# Patient Record
Sex: Male | Born: 1950
Health system: Southern US, Community
[De-identification: ages and names within clinical notes are randomized; demographics above are authoritative.]

## PROBLEM LIST (undated history)

## (undated) DIAGNOSIS — Z87442 Personal history of urinary calculi: Secondary | ICD-10-CM

## (undated) DIAGNOSIS — C61 Malignant neoplasm of prostate: Secondary | ICD-10-CM

## (undated) DIAGNOSIS — K219 Gastro-esophageal reflux disease without esophagitis: Secondary | ICD-10-CM

## (undated) DIAGNOSIS — E785 Hyperlipidemia, unspecified: Secondary | ICD-10-CM

## (undated) DIAGNOSIS — I10 Essential (primary) hypertension: Secondary | ICD-10-CM

## (undated) HISTORY — PX: EXTRACORPOREAL SHOCK WAVE LITHOTRIPSY: SHX1557

## (undated) HISTORY — PX: PILONIDAL CYST EXCISION: SHX744

## (undated) HISTORY — PX: PTOSIS REPAIR: SHX6568

## (undated) HISTORY — DX: Malignant neoplasm of prostate: C61

## (undated) HISTORY — PX: HERNIA REPAIR: SHX51

## (undated) HISTORY — PX: INGUINAL HERNIA REPAIR: SUR1180

## (undated) HISTORY — PX: KIDNEY STONE SURGERY: SHX686

---
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
## 1999-12-23 ENCOUNTER — Encounter: Payer: Self-pay | Admitting: Internal Medicine

## 1999-12-23 ENCOUNTER — Ambulatory Visit (HOSPITAL_COMMUNITY): Admission: RE | Admit: 1999-12-23 | Discharge: 1999-12-23 | Payer: Self-pay | Admitting: Internal Medicine

## 2001-07-01 ENCOUNTER — Emergency Department (HOSPITAL_COMMUNITY): Admission: EM | Admit: 2001-07-01 | Discharge: 2001-07-02 | Payer: Self-pay | Admitting: Emergency Medicine

## 2001-07-02 ENCOUNTER — Ambulatory Visit (HOSPITAL_COMMUNITY): Admission: AD | Admit: 2001-07-02 | Discharge: 2001-07-02 | Payer: Self-pay | Admitting: Urology

## 2001-07-02 ENCOUNTER — Encounter: Payer: Self-pay | Admitting: Emergency Medicine

## 2001-07-05 ENCOUNTER — Ambulatory Visit (HOSPITAL_BASED_OUTPATIENT_CLINIC_OR_DEPARTMENT_OTHER): Admission: RE | Admit: 2001-07-05 | Discharge: 2001-07-05 | Payer: Self-pay | Admitting: Urology

## 2001-07-05 ENCOUNTER — Encounter: Payer: Self-pay | Admitting: Urology

## 2004-10-08 ENCOUNTER — Ambulatory Visit (HOSPITAL_COMMUNITY): Admission: RE | Admit: 2004-10-08 | Discharge: 2004-10-08 | Payer: Self-pay | Admitting: Gastroenterology

## 2006-06-02 DIAGNOSIS — Z87442 Personal history of urinary calculi: Secondary | ICD-10-CM

## 2006-06-02 HISTORY — DX: Personal history of urinary calculi: Z87.442

## 2009-10-16 ENCOUNTER — Ambulatory Visit (HOSPITAL_COMMUNITY): Admission: RE | Admit: 2009-10-16 | Discharge: 2009-10-16 | Payer: Self-pay | Admitting: Neurological Surgery

## 2010-10-18 NOTE — Op Note (Signed)
[facility]  Patient:    Dillon Arias, Dillon Arias Visit Number: [id number] MRN: [id number]          Service Type: DSU Location: DAY Attending Physician:  Ellwood Handler Dictated by:   Verl Dicker, M.D. Proc. Date: 07/02/01 Admit Date:  07/02/2001 Discharge Date: 07/02/2001   CC:         Pearla Dubonnet, M.D.   Operative Report  DOB:  1950/07/02.  UROLOGIST:  Verl Dicker, M.D.  PREOPERATIVE DIAGNOSIS:  An 11-mm left ureteral calculus, 5-mm left renal calculus.  POSTOPERATIVE DIAGNOSIS:  An 11-mm left ureteral calculus, 5-mm left renal calculus.  PROCEDURE:  Cystoscopy, retrogrades, ureteroscopy, and Double J stent placement.  ANESTHESIA:  General.  DRAINS:  26 cm 6-French left Double J stent.  DESCRIPTION OF PROCEDURE:  The patient was prepped and draped in the dorsal lithotomy position after institution of adequate level of general anesthesia. A well-lubricated 17-French panendoscope was gently inserted at the urethral meatus; normal urethra and sphincter, nonobstructive prostate. Careful inspection of the bladder with the 30 and 70 degree lenses showed clear efflux at the right orifice, minimal efflux at the left orifice but no evidence of intravesical pathology. A 21-French panendoscope was then gently inserted. Right retrograde showed normal course and caliber of the ureter. Pelvis and calices with prompt drainage of three to five minutes. Left retrograde showed slight narrowing in the distal ureter but no obvious filling defect. The patient had an 11 mm filling defect in the left mid ureter and a 5 mm filling defect in the left renal pelvis. With injection of contrast at the time of the retrogrades, stone appeared to move several centimeters proximal within the ureter. Decision was made to pass a guidewire alongside the stone which coiled nicely in the upper pole calices. The short 6-French ureteroscope was  then used alongside the guidewire. As irrigant was run with the ureteroscope, the stone continued its proximal migration to a spot beyond reach of the short 6-French scope. The 6-French scope was then withdrawn and replaced with the long 6-French scope which was inserted alongside the guidewire, but again, with irrigant, the stone migrated back into the renal pelvis beyond the reach of the ureteroscope. The ureteroscope was the withdrawn. A 6-French 26 cm Double J stent was then passed over the patients indwelling guidewire with excellent pigtail formation on guidewire removal. The bladder was drained. The cystoscope was removed. The patient was given a B&O suppository and 30 mg of Toradol and was returned to recovery in satisfactory condition. Dictated by:   Verl Dicker, M.D. Attending Physician:  Ellwood Handler DD:  07/02/01 TD:  07/04/01 Job: [id number]

## 2011-04-21 ENCOUNTER — Other Ambulatory Visit: Payer: Self-pay | Admitting: Internal Medicine

## 2011-04-21 DIAGNOSIS — R1013 Epigastric pain: Secondary | ICD-10-CM

## 2011-04-28 ENCOUNTER — Ambulatory Visit
Admission: RE | Admit: 2011-04-28 | Discharge: 2011-04-28 | Disposition: A | Discharge status: Home / Self Care | Payer: 59 | Source: Ambulatory Visit | Attending: Internal Medicine | Admitting: Internal Medicine

## 2011-04-28 DIAGNOSIS — R1013 Epigastric pain: Secondary | ICD-10-CM

## 2013-08-10 ENCOUNTER — Emergency Department
Admission: EM | Admit: 2013-08-10 | Discharge: 2013-08-10 | Disposition: A | Discharge status: Home / Self Care | Payer: 59 | Source: Home / Self Care | Attending: Family Medicine | Admitting: Family Medicine

## 2013-08-10 ENCOUNTER — Encounter: Payer: Self-pay | Admitting: Emergency Medicine

## 2013-08-10 DIAGNOSIS — R1031 Right lower quadrant pain: Secondary | ICD-10-CM

## 2013-08-10 HISTORY — DX: Gastro-esophageal reflux disease without esophagitis: K21.9

## 2013-08-10 HISTORY — DX: Essential (primary) hypertension: I10

## 2013-08-10 HISTORY — DX: Hyperlipidemia, unspecified: E78.5

## 2013-08-10 HISTORY — DX: Personal history of urinary calculi: Z87.442

## 2013-08-10 LAB — POCT CBC W AUTO DIFF (K'VILLE URGENT CARE)

## 2013-08-10 LAB — POCT URINALYSIS DIP (MANUAL ENTRY)
BILIRUBIN UA: NEGATIVE
BILIRUBIN UA: NEGATIVE
Glucose, UA: NEGATIVE
LEUKOCYTES UA: NEGATIVE
Nitrite, UA: NEGATIVE
Protein Ur, POC: NEGATIVE
Spec Grav, UA: 1.025 (ref 1.005–1.03)
Urobilinogen, UA: 0.2 (ref 0–1)
pH, UA: 5.5 (ref 5–8)

## 2013-08-10 NOTE — ED Provider Notes (Signed)
CSN: [id number]     Arrival date & time 08/10/13  1214 History   First MD Initiated Contact with Patient 08/10/13 1308     Chief Complaint  Patient presents with  . Abdominal Pain      HPI Comments: Patient awoke this morning feeling "clammy" and briefly had nausea without vomiting.  He then developed sudden onset of crampy suprapubic pain that radiated to the right.  The pain lasted only about 10 to 15 minutes then resolved completely.  He has had no further pain and feels well.  He denies fevers, chills, and sweats.  No respiratory symptoms although he had a cold about 2 weeks ago that resolved. He has a history of kidney stones about 6 to 7 years ago, but he reports that today's pain was not similar to past pain of nephrolithiasis. He also has a past history of left inguinal hernia repair.  Patient is a 63 y.o. male presenting with abdominal pain. The history is provided by the patient.  Abdominal Pain This is a new problem. The current episode started 6 to 12 hours ago. The problem has been resolved. Associated symptoms include abdominal pain. Pertinent negatives include no chest pain and no headaches. Nothing aggravates the symptoms. Nothing relieves the symptoms. He has tried nothing for the symptoms.    Past Medical History  Diagnosis Date  . Hypertension   . Hyperlipidemia   . GERD (gastroesophageal reflux disease)   . H/O renal calculi    Past Surgical History  Procedure Laterality Date  . Kidney stone surgery    . Pilonidal cyst excision     Family History  Problem Relation Age of Onset  . Hypertension Mother   . Nephrolithiasis Mother   . Hypertension Father   . Diabetes Father   . Nephrolithiasis Father    History  Substance Use Topics  . Smoking status: Never Smoker   . Smokeless tobacco: Not on file  . Alcohol Use: No    Review of Systems  Constitutional: Negative for fever, chills and fatigue.  HENT: Negative.   Eyes: Negative.   Respiratory: Negative.    Cardiovascular: Negative for chest pain.  Gastrointestinal: Positive for nausea and abdominal pain. Negative for vomiting, diarrhea, constipation, blood in stool, abdominal distention and rectal pain.  Genitourinary: Negative for dysuria, urgency, frequency, hematuria, flank pain, decreased urine volume, discharge, scrotal swelling, difficulty urinating, penile pain and testicular pain.  Musculoskeletal: Positive for myalgias. Negative for arthralgias.  Skin: Negative.   Neurological: Negative for headaches.    Allergies  Codeine  Home Medications   Current Outpatient Rx  Name  Route  Sig  Dispense  Refill  . aspirin 81 MG tablet   Oral   Take 81 mg by mouth daily.         Marland Kitchen atorvastatin (LIPITOR) 10 MG tablet   Oral   Take 10 mg by mouth daily.         . pantoprazole (PROTONIX) 40 MG tablet   Oral   Take 40 mg by mouth daily.         . valsartan-hydrochlorothiazide (DIOVAN-HCT) 160-12.5 MG per tablet   Oral   Take 1 tablet by mouth daily.          BP 114/73  Pulse 74  Temp(Src) 98.3 F (36.8 C) (Oral)  Resp 18  Ht 5\' 7"  (1.702 m)  Wt 214 lb (97.07 kg)  BMI 33.51 kg/m2  SpO2 97% Physical Exam Nursing notes and Vital Signs reviewed.  Appearance:  Patient appears stated age, and in no acute distress.  Patient is obese (BMI 33.5) Eyes:  Pupils are equal, round, and reactive to light and accomodation.  Extraocular movement is intact.  Conjunctivae are not inflamed  Nose:  Normal  Pharynx:  Normal Neck:  Supple.  No adenopathy Lungs:  Clear to auscultation.  Breath sounds are equal.  Heart:  Regular rate and rhythm without murmurs, rubs, or gallops.  Abdomen:  Nontender without masses or hepatosplenomegaly.  Bowel sounds are present.  No CVA or flank tenderness.  Back:  No tenderness to palpation  Extremities:  No edema.  No calf tenderness Skin:  No rash present.  Genitourinary:  Penis normal without lesions or urethral discharge.  Scrotum is normal.  Testes  are descended bilaterally without nodules or tenderness.  No hernias are palpated; however with valsalva there is some non-tender bulging of the right abdominal inguinal ring.  No regional lymphadenopathy palpated   ED Course  Procedures  none    Labs Reviewed  POCT CBC W AUTO DIFF ([facility]):  WBC 7.0; LY 24.3; MO 8.5; GR 67.2; Hgb 15.1; Platelets 227   POCT URINALYSIS DIP (MANUAL ENTRY) BLO Trace-lysed, otherwise negative        MDM   1. Right lower quadrant abdominal pain; resolved.  Unclear etiology.  Normal white blood count is reassuring    Use care when lifting. Followup with Family Doctor if symptoms recur.    Kandra Nicolas, MD 08/12/13 [phone]

## 2013-08-10 NOTE — Discharge Instructions (Signed)
Use care when lifting.

## 2013-08-10 NOTE — ED Notes (Signed)
Pt c/o RLQ abd pain with a clammy feeling x this morning which has now resolved. Denies N/V/D or fever. He reports a history of kidney stones.

## 2013-11-23 ENCOUNTER — Emergency Department (INDEPENDENT_AMBULATORY_CARE_PROVIDER_SITE_OTHER): Payer: 59

## 2013-11-23 ENCOUNTER — Encounter: Payer: Self-pay | Admitting: Emergency Medicine

## 2013-11-23 ENCOUNTER — Emergency Department
Admission: EM | Admit: 2013-11-23 | Discharge: 2013-11-23 | Disposition: A | Discharge status: Home / Self Care | Payer: 59 | Source: Home / Self Care | Attending: Family Medicine | Admitting: Family Medicine

## 2013-11-23 DIAGNOSIS — S92919A Unspecified fracture of unspecified toe(s), initial encounter for closed fracture: Secondary | ICD-10-CM

## 2013-11-23 DIAGNOSIS — S92911A Unspecified fracture of right toe(s), initial encounter for closed fracture: Secondary | ICD-10-CM

## 2013-11-23 DIAGNOSIS — IMO0002 Reserved for concepts with insufficient information to code with codable children: Secondary | ICD-10-CM

## 2013-11-23 NOTE — ED Notes (Signed)
Bed: PQA4 Expected date:  Expected time:  Means of arrival:  Comments: [facility]

## 2013-11-23 NOTE — ED Notes (Signed)
Pt c/o RT 5th toe injury 10 days ago, still has some pain and swelling.

## 2013-11-23 NOTE — Discharge Instructions (Signed)
Buddy tape toes until healed.  Ensure adequate vitamin D and calcium intake.  Wear protective shoes at work.   Buddy Taping of Toes We have taped your toes together to keep them from moving. This is called "buddy taping" since we used a part of your own body to keep the injured part still. We placed soft padding between your toes to keep them from rubbing against each other. Buddy taping will help with healing and to reduce pain. Keep your toes buddy taped together for as long as directed by your caregiver. HOME CARE INSTRUCTIONS   Raise your injured area above the level of your heart while sitting or lying down. Prop it up with pillows.  An ice pack used every twenty minutes, while awake, for the first one to two days may be helpful. Put ice in a plastic bag and put a towel between the bag and your skin.  Watch for signs that the taping is too tight. These signs may be:  Numbness of your taped toes.  Coolness of your taped toes.  Color change in the area beyond the tape.  Increased pain.  If you have any of these signs, loosen or rewrap the tape. If you need to loosen or rewrap the buddy tape, make sure you use the padding again. SEEK IMMEDIATE MEDICAL CARE IF:   You have worse pain, swelling, inflammation (soreness), drainage or bleeding after you rewrap the tape.  Any new problems occur. MAKE SURE YOU:   Understand these instructions.  Will watch your condition.  Will get help right away if you are not doing well or get worse. Document Released: 02/21/2004 Document Revised: 08/11/2011 Document Reviewed: 05/16/2008 [facility] Patient Information 2015 [place], [place]. This information is not intended to replace advice given to you by your health care provider. Make sure you discuss any questions you have with your health care provider.

## 2013-11-23 NOTE — ED Provider Notes (Signed)
CSN: [id number]     Arrival date & time 11/23/13  1606 History   First MD Initiated Contact with Patient 11/23/13 1626     Chief Complaint  Patient presents with  . Toe Injury      HPI Comments: About 10 days ago a cart rolled over the lateral aspect of patient's right foot.  He has had persistent pain in the right 5th toe.  Patient is a 63 y.o. male presenting with toe pain. The history is provided by the patient.  Toe Pain This is a new problem. The current episode started more than 1 week ago. The problem occurs constantly. The problem has been gradually worsening. Associated symptoms comments: none. The symptoms are aggravated by walking. Nothing relieves the symptoms. Treatments tried: ibuprofen. The treatment provided mild relief.    Past Medical History  Diagnosis Date  . Hypertension   . Hyperlipidemia   . GERD (gastroesophageal reflux disease)   . H/O renal calculi    Past Surgical History  Procedure Laterality Date  . Kidney stone surgery    . Pilonidal cyst excision     Family History  Problem Relation Age of Onset  . Hypertension Mother   . Nephrolithiasis Mother   . Hypertension Father   . Diabetes Father   . Nephrolithiasis Father    History  Substance Use Topics  . Smoking status: Never Smoker   . Smokeless tobacco: Not on file  . Alcohol Use: No    Review of Systems  All other systems reviewed and are negative.   Allergies  Codeine  Home Medications   Prior to Admission medications   Medication Sig Start Date End Date Taking? Authorizing Shahzad Thomann  ibuprofen (ADVIL,MOTRIN) 400 MG tablet Take 400 mg by mouth every 6 (six) hours as needed.   Yes Historical Delando Satter, MD  aspirin 81 MG tablet Take 81 mg by mouth daily.    Historical Merisa Julio, MD  atorvastatin (LIPITOR) 10 MG tablet Take 10 mg by mouth daily.    Historical Sherika Kubicki, MD  pantoprazole (PROTONIX) 40 MG tablet Take 40 mg by mouth daily.    Historical Truxton Stupka, MD    valsartan-hydrochlorothiazide (DIOVAN-HCT) 160-12.5 MG per tablet Take 1 tablet by mouth daily.    Historical Keshon Markovitz, MD   BP 135/82  Pulse 82  Temp(Src) 98.2 F (36.8 C) (Oral)  Resp 18  Wt 217 lb (98.431 kg)  SpO2 97% Physical Exam  Nursing note and vitals reviewed. Constitutional: He is oriented to person, place, and time. He appears well-developed and well-nourished. No distress.  HENT:  Head: Normocephalic.  Eyes: Conjunctivae are normal. Pupils are equal, round, and reactive to light.  Musculoskeletal:       Right shoulder: He exhibits tenderness and bony tenderness. He exhibits normal range of motion, no swelling, no crepitus and no deformity.       Feet:  Right fifth toe has mild tenderness over the PIP joint.  No erythema or warmth.  Cap refill intact.    Neurological: He is alert and oriented to person, place, and time.  Skin: Skin is warm and dry.    ED Course  Procedures  none    Imaging Review Dg Toe 5th Right  11/23/2013   CLINICAL DATA:  Fifth toe injury.  EXAM: RIGHT FIFTH TOE  COMPARISON:  None.  FINDINGS: Minimally displaced fracture of the distal aspect of the proximal phalanx of right fifth digit. No other abnormality identified. No radiopaque foreign body.  IMPRESSION: Minimally displaced fracture  of the distal aspect of the proximal phalanx right fifth digit.   Electronically Signed   By: Marcello Moores  Register   On: 11/23/2013 16:57     MDM   1. Fractured toe, right, closed, initial encounter    Toe strapped using "buddy tape" technique. Buddy tape toes until healed.  Ensure adequate vitamin D and calcium intake.  Wear protective shoes at work. Followup with Dr. Aundria Mems ([facility]) if not improving about three weeks.    Kandra Nicolas, MD 11/23/13 [phone]

## 2013-11-28 ENCOUNTER — Telehealth: Payer: Self-pay

## 2013-11-28 NOTE — ED Notes (Signed)
Left a message on voice mail asking how patient is feeling and advising to call back with any questions or concerns.  

## 2014-01-09 ENCOUNTER — Emergency Department (INDEPENDENT_AMBULATORY_CARE_PROVIDER_SITE_OTHER)
Admission: EM | Admit: 2014-01-09 | Discharge: 2014-01-09 | Disposition: A | Discharge status: Home / Self Care | Payer: 59 | Source: Home / Self Care | Attending: Emergency Medicine | Admitting: Emergency Medicine

## 2014-01-09 ENCOUNTER — Encounter: Payer: Self-pay | Admitting: Emergency Medicine

## 2014-01-09 DIAGNOSIS — S92919A Unspecified fracture of unspecified toe(s), initial encounter for closed fracture: Secondary | ICD-10-CM

## 2014-01-09 DIAGNOSIS — IMO0001 Reserved for inherently not codable concepts without codable children: Secondary | ICD-10-CM

## 2014-01-09 DIAGNOSIS — S92911D Unspecified fracture of right toe(s), subsequent encounter for fracture with routine healing: Secondary | ICD-10-CM

## 2014-01-09 NOTE — ED Provider Notes (Signed)
CSN: [id number]     Arrival date & time 01/09/14  1426 History   First MD Initiated Contact with Patient 01/09/14 1441     Chief Complaint  Patient presents with  . Toe Injury   (Consider location/radiation/quality/duration/timing/severity/associated sxs/prior Treatment) HPI On 11/23/13, patient was seen here for minimally displaced fracture of the distal aspect of the proximal  phalanx of right fifth digit.(toe).--At that time he had severe pain and swelling and bruising. Was treated conservatively with buddy taping, and he has continued to improve. He no longer has pain. But does notice mild progressive redness and slight swelling of right fifth toe throughout the day, when he is working standing on his feet on a concrete floor all day. No numbness or weakness.  No drainage or bleeding.  Past Medical History  Diagnosis Date  . Hypertension   . Hyperlipidemia   . GERD (gastroesophageal reflux disease)   . H/O renal calculi    Past Surgical History  Procedure Laterality Date  . Kidney stone surgery    . Pilonidal cyst excision     Family History  Problem Relation Age of Onset  . Hypertension Mother   . Nephrolithiasis Mother   . Hypertension Father   . Diabetes Father   . Nephrolithiasis Father    History  Substance Use Topics  . Smoking status: Never Smoker   . Smokeless tobacco: Not on file  . Alcohol Use: No    Review of Systems  All other systems reviewed and are negative.   Allergies  Codeine  Home Medications   Prior to Admission medications   Medication Sig Start Date End Date Taking? Authorizing Provider  aspirin 81 MG tablet Take 81 mg by mouth daily.    Historical Provider, MD  atorvastatin (LIPITOR) 10 MG tablet Take 10 mg by mouth daily.    Historical Provider, MD  ibuprofen (ADVIL,MOTRIN) 400 MG tablet Take 400 mg by mouth every 6 (six) hours as needed.    Historical Provider, MD  pantoprazole (PROTONIX) 40 MG tablet Take 40 mg by mouth daily.     Historical Provider, MD  valsartan-hydrochlorothiazide (DIOVAN-HCT) 160-12.5 MG per tablet Take 1 tablet by mouth daily.    Historical Provider, MD   BP 126/73  Pulse 75  Temp(Src) 98.3 F (36.8 C) (Oral)  Resp 16  SpO2 96% Physical Exam  Nursing note and vitals reviewed. Constitutional: He is oriented to person, place, and time. He appears well-developed and well-nourished. No distress.  HENT:  Head: Normocephalic and atraumatic.  Eyes: Conjunctivae and EOM are normal. Pupils are equal, round, and reactive to light. No scleral icterus.  Neck: Normal range of motion.  Cardiovascular: Normal rate.   Pulmonary/Chest: Effort normal.  Abdominal: He exhibits no distension.  Musculoskeletal: Normal range of motion.       Right foot: He exhibits normal range of motion, no bony tenderness and normal capillary refill.       Feet:  Right fifth toe: Minimal edema. Minimal redness. No heat or induration or fluctuance. Neurovascular intact. Capillary refill normal. Nontender. Normal range of motion. No bony tenderness Toenail normal  Neurological: He is alert and oriented to person, place, and time.  Skin: Skin is warm.  Psychiatric: He has a normal mood and affect.    ED Course  Procedures (including critical care time) Labs Review Labs Reviewed - No data to display  Imaging Review No results found.   MDM   1. Fractured toe, right, with routine healing, subsequent encounter  6 weeks status post fracture Clinically, appropriate healing without signs of complication.  He declined repeating x-ray  We discussed. Reassurance. Various nonpharmacologic measures discussed. Use Tylenol or ibuprofen when necessary He declined option of following up with sports medicine specialist, although I advised followup with sports medicine specialist or orthopedist in 2-3 weeks if he doesn't continue to improve. I explained that most fractures can take up yo 12 weeks to heal.  Precautions  discussed. Red flags discussed. Questions invited and answered. Patient voiced understanding and agreement.     Jacqulyn Cane, MD 01/09/14 [phone]

## 2014-01-09 NOTE — ED Notes (Signed)
Patient had non-displaced fracture of #5 toe on right foot; was seen and diagnose and no longer has pain but does notice progressive redness throughout the day.

## 2014-10-21 ENCOUNTER — Emergency Department (HOSPITAL_COMMUNITY)
Admission: EM | Admit: 2014-10-21 | Discharge: 2014-10-21 | Disposition: A | Discharge status: Home / Self Care | Payer: Medicare Other | Attending: Emergency Medicine | Admitting: Emergency Medicine

## 2014-10-21 ENCOUNTER — Emergency Department
Admission: EM | Admit: 2014-10-21 | Discharge: 2014-10-21 | Disposition: A | Discharge status: Home / Self Care | Payer: Medicare Other | Source: Home / Self Care

## 2014-10-21 ENCOUNTER — Encounter (HOSPITAL_COMMUNITY): Payer: Self-pay | Admitting: *Deleted

## 2014-10-21 DIAGNOSIS — E785 Hyperlipidemia, unspecified: Secondary | ICD-10-CM | POA: Insufficient documentation

## 2014-10-21 DIAGNOSIS — I10 Essential (primary) hypertension: Secondary | ICD-10-CM | POA: Insufficient documentation

## 2014-10-21 DIAGNOSIS — M545 Low back pain, unspecified: Secondary | ICD-10-CM

## 2014-10-21 DIAGNOSIS — R109 Unspecified abdominal pain: Secondary | ICD-10-CM | POA: Insufficient documentation

## 2014-10-21 DIAGNOSIS — Z87442 Personal history of urinary calculi: Secondary | ICD-10-CM | POA: Insufficient documentation

## 2014-10-21 DIAGNOSIS — Z7982 Long term (current) use of aspirin: Secondary | ICD-10-CM | POA: Insufficient documentation

## 2014-10-21 DIAGNOSIS — K219 Gastro-esophageal reflux disease without esophagitis: Secondary | ICD-10-CM | POA: Diagnosis not present

## 2014-10-21 DIAGNOSIS — Z79899 Other long term (current) drug therapy: Secondary | ICD-10-CM | POA: Diagnosis not present

## 2014-10-21 LAB — URINALYSIS, ROUTINE W REFLEX MICROSCOPIC
Bilirubin Urine: NEGATIVE
Glucose, UA: NEGATIVE mg/dL
Hgb urine dipstick: NEGATIVE
Ketones, ur: NEGATIVE mg/dL
LEUKOCYTES UA: NEGATIVE
Nitrite: NEGATIVE
PH: 5.5 (ref 5.0–8.0)
PROTEIN: NEGATIVE mg/dL
Specific Gravity, Urine: 1.036 — ABNORMAL HIGH (ref 1.005–1.030)
UROBILINOGEN UA: 0.2 mg/dL (ref 0.0–1.0)

## 2014-10-21 LAB — CBC WITH DIFFERENTIAL/PLATELET
BASOS PCT: 0 % (ref 0–1)
Basophils Absolute: 0 10*3/uL (ref 0.0–0.1)
EOS ABS: 0.2 10*3/uL (ref 0.0–0.7)
Eosinophils Relative: 2 % (ref 0–5)
HCT: 45.9 % (ref 39.0–52.0)
HEMOGLOBIN: 15.6 g/dL (ref 13.0–17.0)
Lymphocytes Relative: 20 % (ref 12–46)
Lymphs Abs: 1.8 10*3/uL (ref 0.7–4.0)
MCH: 31.5 pg (ref 26.0–34.0)
MCHC: 34 g/dL (ref 30.0–36.0)
MCV: 92.7 fL (ref 78.0–100.0)
Monocytes Absolute: 0.7 10*3/uL (ref 0.1–1.0)
Monocytes Relative: 8 % (ref 3–12)
NEUTROS ABS: 6 10*3/uL (ref 1.7–7.7)
NEUTROS PCT: 70 % (ref 43–77)
Platelets: 228 10*3/uL (ref 150–400)
RBC: 4.95 MIL/uL (ref 4.22–5.81)
RDW: 12.9 % (ref 11.5–15.5)
WBC: 8.6 10*3/uL (ref 4.0–10.5)

## 2014-10-21 LAB — COMPREHENSIVE METABOLIC PANEL
ALBUMIN: 4.4 g/dL (ref 3.5–5.0)
ALK PHOS: 65 U/L (ref 38–126)
ALT: 24 U/L (ref 17–63)
AST: 20 U/L (ref 15–41)
Anion gap: 11 (ref 5–15)
BILIRUBIN TOTAL: 0.8 mg/dL (ref 0.3–1.2)
BUN: 16 mg/dL (ref 6–20)
CALCIUM: 8.9 mg/dL (ref 8.9–10.3)
CHLORIDE: 104 mmol/L (ref 101–111)
CO2: 24 mmol/L (ref 22–32)
Creatinine, Ser: 0.72 mg/dL (ref 0.61–1.24)
GFR calc non Af Amer: >60 mL/min (ref >60)
GLUCOSE: 115 mg/dL — AB (ref 65–99)
Potassium: 3.8 mmol/L (ref 3.5–5.1)
Sodium: 139 mmol/L (ref 135–145)
TOTAL PROTEIN: 7.1 g/dL (ref 6.5–8.1)

## 2014-10-21 NOTE — ED Provider Notes (Signed)
CSN: [id number]     Arrival date & time 10/21/14  1112 History   First MD Initiated Contact with Patient 10/21/14 1121     Chief Complaint  Patient presents with  . Flank Pain     (Consider location/radiation/quality/duration/timing/severity/associated sxs/prior Treatment) HPI Dillon Arias is a 64 year-old male with past medical history of hypertension, hyperlipidemia, renal calculi who presents the ER complaining of left-sided flank pain. Patient reports in the past several days he has noticed a "dull ache" in his left flank. Patient denies any specific aggravating or alleviating factors. Patient states that also the past several days he has been lifting heavy boxes at home, and doing more yard work in usual. Patient reports mild changes with range of motion of his back pain. Patient denies any associated nausea, vomiting, fever, dysuria, abdominal pain. Patient states she's had kidney stones in the past, and although he felt pain in a similar place, he is unsure as to whether this is pain secondary to his kidney stones or musculoskeletal.  Past Medical History  Diagnosis Date  . Hypertension   . Hyperlipidemia   . GERD (gastroesophageal reflux disease)   . H/O renal calculi    Past Surgical History  Procedure Laterality Date  . Kidney stone surgery    . Pilonidal cyst excision    . Hernia repair    . Ptosis repair    . Extracorporeal shock wave lithotripsy     Family History  Problem Relation Age of Onset  . Hypertension Mother   . Nephrolithiasis Mother   . Hypertension Father   . Diabetes Father   . Nephrolithiasis Father    History  Substance Use Topics  . Smoking status: Never Smoker   . Smokeless tobacco: Not on file  . Alcohol Use: No    Review of Systems  Constitutional: Negative for fever.  HENT: Negative for trouble swallowing.   Eyes: Negative for visual disturbance.  Respiratory: Negative for shortness of breath.   Cardiovascular: Negative for chest  pain.  Gastrointestinal: Negative for nausea, vomiting and abdominal pain.  Genitourinary: Positive for flank pain. Negative for dysuria.  Musculoskeletal: Negative for neck pain.  Skin: Negative for rash.  Neurological: Negative for dizziness, weakness and numbness.  Psychiatric/Behavioral: Negative.       Allergies  Codeine  Home Medications   Prior to Admission medications   Medication Sig Start Date End Date Taking? Authorizing Provider  aspirin 81 MG tablet Take 81 mg by mouth every other day.    Yes Historical Provider, MD  atorvastatin (LIPITOR) 10 MG tablet Take 10 mg by mouth daily.   Yes Historical Provider, MD  cholecalciferol (VITAMIN D) 1000 UNITS tablet Take 1,000 Units by mouth daily.   Yes Historical Provider, MD  ibuprofen (ADVIL,MOTRIN) 400 MG tablet Take 400 mg by mouth every 6 (six) hours as needed for mild pain.    Yes Historical Provider, MD  Multiple Vitamin (MULTIVITAMIN WITH MINERALS) TABS tablet Take 1 tablet by mouth daily.   Yes Historical Provider, MD  pantoprazole (PROTONIX) 20 MG tablet Take 20 mg by mouth daily.   Yes Historical Provider, MD  sildenafil (VIAGRA) 25 MG tablet Take 25 mg by mouth daily as needed for erectile dysfunction.   Yes Historical Provider, MD  valsartan-hydrochlorothiazide (DIOVAN-HCT) 160-12.5 MG per tablet Take 1 tablet by mouth daily.   Yes Historical Provider, MD   BP 158/96 mmHg  Pulse 83  Temp(Src) 97.9 F (36.6 C) (Oral)  Resp 14  SpO2  98% Physical Exam  Constitutional: He is oriented to person, place, and time. He appears well-developed and well-nourished. No distress.  HENT:  Head: Normocephalic and atraumatic.  Mouth/Throat: Oropharynx is clear and moist. No oropharyngeal exudate.  Eyes: Right eye exhibits no discharge. Left eye exhibits no discharge. No scleral icterus.  Neck: Normal range of motion.  Cardiovascular: Normal rate, regular rhythm and normal heart sounds.   No murmur heard. Pulmonary/Chest:  Effort normal and breath sounds normal. No respiratory distress.  Abdominal: Soft. Normal appearance and bowel sounds are normal. There is no tenderness. There is no rigidity, no guarding, no CVA tenderness, no tenderness at McBurney's point and negative Murphy's sign.  Musculoskeletal: Normal range of motion. He exhibits no edema or tenderness.       Back:  Mild tenderness to palpation of L3-L4 region, one L2 region laterally on the left side. No spinous process tenderness.  Neurological: He is alert and oriented to person, place, and time. He has normal strength. No cranial nerve deficit or sensory deficit. He displays a negative Romberg sign. Coordination and gait normal. GCS eye subscore is 4. GCS verbal subscore is 5. GCS motor subscore is 6.  Patient fully alert, answering questions appropriately in full, clear sentences. Cranial nerves II through XII grossly intact. Motor strength 5 out of 5 in all major muscle groups of upper and lower extremities. Distal sensation intact.   Skin: Skin is warm and dry. No rash noted. He is not diaphoretic.  Psychiatric: He has a normal mood and affect.  Nursing note and vitals reviewed.   ED Course  Procedures (including critical care time) Labs Review Labs Reviewed  COMPREHENSIVE METABOLIC PANEL - Abnormal; Notable for the following:    Glucose, Bld 115 (*)    All other components within normal limits  URINALYSIS, ROUTINE W REFLEX MICROSCOPIC - Abnormal; Notable for the following:    Specific Gravity, Urine 1.036 (*)    All other components within normal limits  CBC WITH DIFFERENTIAL/PLATELET    Imaging Review No results found.   EKG Interpretation None      MDM   Final diagnoses:  Flank pain  Left-sided low back pain without sciatica    Patient here with mild left-sided flank pain. On physical exam there is noted to be mild tenderness to palpation, just pain is reproducible, more likely to be musculoskeletal in nature. No  neurological deficits and normal neuro exam.  Patient can walk but states is painful.  No loss of bowel or bladder control.  No concern for cauda equina.  No fever, night sweats, weight loss, h/o cancer, IVDU.  RICE protocol and pain medicine indicated and discussed with patient. Spoke with patient about options for workup, he believes his back pain is most likely musculoskeletal in nature. Patient has unremarkable urinalysis from [facility] urgent care. Lab workup was unremarkable for acute pathology here today. Due to the fact the patient is comfortable, was offered pain management, however does not believe it is necessary as his pain is tolerable, he is hemodynamically stable, well-appearing and in no acute distress, patient stable for discharge at this time. Further workup or imaging is necessary as this would not change treatment modalities at this point. Discussed return precautions with patient, and encourage him to follow up with PCP or his urologist. Patient verbalizes understanding and agreement of this plan.  BP 158/96 mmHg  Pulse 83  Temp(Src) 97.9 F (36.6 C) (Oral)  Resp 14  SpO2 98%  Signed,  Tremaine Fuhriman  Truitt Leep, PA-C 4:18 PM  Patient seen and discussed with Dr. Christ Kick, MD    Dahlia Bailiff, PA-C 10/21/14 [place], MD 10/24/14 [phone]

## 2014-10-21 NOTE — ED Provider Notes (Signed)
  Face-to-face evaluation   History: Left flank pain, he is been lifting more than usual recently.  Physical exam: Alert, calm, cooperative. He appears comfortable. Back no significant lumbar or thoracic tenderness. No costal vertebral angle tenderness.  Medical screening examination/treatment/procedure(s) were conducted as a shared visit with non-physician practitioner(s) and myself.  I personally evaluated the patient during the encounter  Daleen Bo, MD 10/24/14 [phone]

## 2014-10-21 NOTE — Discharge Instructions (Signed)
Flank Pain Flank pain refers to pain that is located on the side of the body between the upper abdomen and the back. The pain may occur over a short period of time (acute) or may be long-term or reoccurring (chronic). It may be mild or severe. Flank pain can be caused by many things. CAUSES  Some of the more common causes of flank pain include:  Muscle strains.   Muscle spasms.   A disease of your spine (vertebral disk disease).   A lung infection (pneumonia).   Fluid around your lungs (pulmonary edema).   A kidney infection.   Kidney stones.   A very painful skin rash caused by the chickenpox virus (shingles).   Gallbladder disease.  [facility] care will depend on the cause of your pain. In general,  Rest as directed by your caregiver.  Drink enough fluids to keep your urine clear or pale yellow.  Only take over-the-counter or prescription medicines as directed by your caregiver. Some medicines may help relieve the pain.  Tell your caregiver about any changes in your pain.  Follow up with your caregiver as directed. SEEK IMMEDIATE MEDICAL CARE IF:   Your pain is not controlled with medicine.   You have new or worsening symptoms.  Your pain increases.   You have abdominal pain.   You have shortness of breath.   You have persistent nausea or vomiting.   You have swelling in your abdomen.   You feel faint or pass out.   You have blood in your urine.  You have a fever or persistent symptoms for more than 2-3 days.  You have a fever and your symptoms suddenly get worse. MAKE SURE YOU:   Understand these instructions.  Will watch your condition.  Will get help right away if you are not doing well or get worse. Document Released: 07/10/2005 Document Revised: 02/11/2012 Document Reviewed: 01/01/2012 [facility] Patient Information 2015 [place], [place]. This information is not intended to replace advice given to you by your  health care provider. Make sure you discuss any questions you have with your health care provider.  Lumbosacral Strain Lumbosacral strain is a strain of any of the parts that make up your lumbosacral vertebrae. Your lumbosacral vertebrae are the bones that make up the lower third of your backbone. Your lumbosacral vertebrae are held together by muscles and tough, fibrous tissue (ligaments).  CAUSES  A sudden blow to your back can cause lumbosacral strain. Also, anything that causes an excessive stretch of the muscles in the low back can cause this strain. This is typically seen when people exert themselves strenuously, fall, lift heavy objects, bend, or crouch repeatedly. RISK FACTORS  Physically demanding work.  Participation in pushing or pulling sports or sports that require a sudden twist of the back (tennis, golf, baseball).  Weight lifting.  Excessive lower back curvature.  Forward-tilted pelvis.  Weak back or abdominal muscles or both.  Tight hamstrings. SIGNS AND SYMPTOMS  Lumbosacral strain may cause pain in the area of your injury or pain that moves (radiates) down your leg.  DIAGNOSIS Your health care provider can often diagnose lumbosacral strain through a physical exam. In some cases, you may need tests such as X-ray exams.  TREATMENT  Treatment for your lower back injury depends on many factors that your clinician will have to evaluate. However, most treatment will include the use of anti-inflammatory medicines. HOME CARE INSTRUCTIONS   Avoid hard physical activities (tennis, racquetball, waterskiing) if  you are not in proper physical condition for it. This may aggravate or create problems.  If you have a back problem, avoid sports requiring sudden body movements. Swimming and walking are generally safer activities.  Maintain good posture.  Maintain a healthy weight.  For acute conditions, you may put ice on the injured area.  Put ice in a plastic bag.  Place a  towel between your skin and the bag.  Leave the ice on for 20 minutes, 2-3 times a day.  When the low back starts healing, stretching and strengthening exercises may be recommended. SEEK MEDICAL CARE IF:  Your back pain is getting worse.  You experience severe back pain not relieved with medicines. SEEK IMMEDIATE MEDICAL CARE IF:   You have numbness, tingling, weakness, or problems with the use of your arms or legs.  There is a change in bowel or bladder control.  You have increasing pain in any area of the body, including your belly (abdomen).  You notice shortness of breath, dizziness, or feel faint.  You feel sick to your stomach (nauseous), are throwing up (vomiting), or become sweaty.  You notice discoloration of your toes or legs, or your feet get very cold. MAKE SURE YOU:   Understand these instructions.  Will watch your condition.  Will get help right away if you are not doing well or get worse. Document Released: 02/26/2005 Document Revised: 05/24/2013 Document Reviewed: 01/05/2013 [facility] Patient Information 2015 [place], [place]. This information is not intended to replace advice given to you by your health care provider. Make sure you discuss any questions you have with your health care provider.   Back Exercises Back exercises help treat and prevent back injuries. The goal of back exercises is to increase the strength of your abdominal and back muscles and the flexibility of your back. These exercises should be started when you no longer have back pain. Back exercises include:  Pelvic Tilt. Lie on your back with your knees bent. Tilt your pelvis until the lower part of your back is against the floor. Hold this position 5 to 10 sec and repeat 5 to 10 times.  Knee to Chest. Pull first 1 knee up against your chest and hold for 20 to 30 seconds, repeat this with the other knee, and then both knees. This may be done with the other leg straight or bent, whichever feels  better.  Sit-Ups or Curl-Ups. Bend your knees 90 degrees. Start with tilting your pelvis, and do a partial, slow sit-up, lifting your trunk only 30 to 45 degrees off the floor. Take at least 2 to 3 seconds for each sit-up. Do not do sit-ups with your knees out straight. If partial sit-ups are difficult, simply do the above but with only tightening your abdominal muscles and holding it as directed.  Hip-Lift. Lie on your back with your knees flexed 90 degrees. Push down with your feet and shoulders as you raise your hips a couple inches off the floor; hold for 10 seconds, repeat 5 to 10 times.  Back arches. Lie on your stomach, propping yourself up on bent elbows. Slowly press on your hands, causing an arch in your low back. Repeat 3 to 5 times. Any initial stiffness and discomfort should lessen with repetition over time.  Shoulder-Lifts. Lie face down with arms beside your body. Keep hips and torso pressed to floor as you slowly lift your head and shoulders off the floor. Do not overdo your exercises, especially in the beginning. Exercises  may cause you some mild back discomfort which lasts for a few minutes; however, if the pain is more severe, or lasts for more than 15 minutes, do not continue exercises until you see your caregiver. Improvement with exercise therapy for back problems is slow.  See your caregivers for assistance with developing a proper back exercise program. Document Released: 06/26/2004 Document Revised: 08/11/2011 Document Reviewed: 03/20/2011 [facility] Patient Information 2015 [place], [place]. This information is not intended to replace advice given to you by your health care provider. Make sure you discuss any questions you have with your health care provider.

## 2014-10-21 NOTE — ED Notes (Signed)
Pt reports L flank pain x2 days. Hx hydronephrosis, kidney stone 10 years ago. Denies blood in urine. 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 Has been trying to treat with tylenol, ibuprofen without relief. Went to [facility] and was sent here, normal UA there.

## 2014-12-21 ENCOUNTER — Other Ambulatory Visit: Payer: Self-pay | Admitting: Gastroenterology

## 2015-01-17 ENCOUNTER — Encounter (HOSPITAL_COMMUNITY): Payer: Self-pay | Admitting: *Deleted

## 2015-01-22 ENCOUNTER — Ambulatory Visit (HOSPITAL_COMMUNITY): Payer: Medicare Other | Admitting: Anesthesiology

## 2015-01-22 ENCOUNTER — Ambulatory Visit (HOSPITAL_COMMUNITY)
Admission: RE | Admit: 2015-01-22 | Discharge: 2015-01-22 | Disposition: A | Discharge status: Home / Self Care | Payer: Medicare Other | Source: Ambulatory Visit | Attending: Gastroenterology | Admitting: Gastroenterology

## 2015-01-22 ENCOUNTER — Encounter (HOSPITAL_COMMUNITY): Payer: Self-pay

## 2015-01-22 ENCOUNTER — Encounter (HOSPITAL_COMMUNITY)
Admission: RE | Disposition: A | Discharge status: Home / Self Care | Payer: Self-pay | Source: Ambulatory Visit | Attending: Gastroenterology

## 2015-01-22 DIAGNOSIS — E780 Pure hypercholesterolemia: Secondary | ICD-10-CM | POA: Insufficient documentation

## 2015-01-22 DIAGNOSIS — K5730 Diverticulosis of large intestine without perforation or abscess without bleeding: Secondary | ICD-10-CM | POA: Insufficient documentation

## 2015-01-22 DIAGNOSIS — Z79899 Other long term (current) drug therapy: Secondary | ICD-10-CM | POA: Insufficient documentation

## 2015-01-22 DIAGNOSIS — K219 Gastro-esophageal reflux disease without esophagitis: Secondary | ICD-10-CM | POA: Insufficient documentation

## 2015-01-22 DIAGNOSIS — I10 Essential (primary) hypertension: Secondary | ICD-10-CM | POA: Diagnosis not present

## 2015-01-22 DIAGNOSIS — Z1211 Encounter for screening for malignant neoplasm of colon: Secondary | ICD-10-CM | POA: Diagnosis not present

## 2015-01-22 DIAGNOSIS — M479 Spondylosis, unspecified: Secondary | ICD-10-CM | POA: Insufficient documentation

## 2015-01-22 HISTORY — PX: COLONOSCOPY WITH PROPOFOL: SHX5780

## 2015-01-22 LAB — HM COLONOSCOPY

## 2015-01-22 SURGERY — COLONOSCOPY WITH PROPOFOL
Anesthesia: Monitor Anesthesia Care

## 2015-01-22 MED ORDER — PROPOFOL 10 MG/ML IV BOLUS
INTRAVENOUS | Status: AC
  Filled 2015-01-22: qty 20

## 2015-01-22 MED ORDER — SODIUM CHLORIDE 0.9 % IV SOLN: INTRAVENOUS | Status: DC

## 2015-01-22 MED ORDER — LACTATED RINGERS IV SOLN
INTRAVENOUS | Status: DC
  Administered 2015-01-22: 1000 mL via INTRAVENOUS

## 2015-01-22 MED ORDER — PROPOFOL 10 MG/ML IV BOLUS
INTRAVENOUS | Status: DC | PRN
  Administered 2015-01-22 (×14): 20 mg via INTRAVENOUS

## 2015-01-22 SURGICAL SUPPLY — 22 items

## 2015-01-22 NOTE — Transfer of Care (Signed)
Immediate Anesthesia Transfer of Care Note  Patient: Dillon Arias  Procedure(s) Performed: Procedure(s): COLONOSCOPY WITH PROPOFOL (N/A)  Patient Location: PACU, endo recovery  Anesthesia Type:MAC  Level of Consciousness: Patient easily awoken, sedated, comfortable, cooperative, following commands, responds to stimulation.   Airway & Oxygen Therapy: Patient spontaneously breathing, ventilating well, oxygen via simple oxygen mask.  Post-op Assessment: Report given to PACU RN, vital signs reviewed and stable, moving all extremities.   Post vital signs: Reviewed and stable.  Complications: No apparent anesthesia complications

## 2015-01-22 NOTE — Anesthesia Preprocedure Evaluation (Signed)
Anesthesia Evaluation  Patient identified by MRN, date of birth, ID band Patient awake    Reviewed: Allergy & Precautions, NPO status , Patient's Chart, lab work & pertinent test results  Airway Mallampati: II  TM Distance: >3 FB Neck ROM: Full    Dental no notable dental hx.    Pulmonary neg pulmonary ROS,  breath sounds clear to auscultation  Pulmonary exam normal       Cardiovascular hypertension, Pt. on medications negative cardio ROS Normal cardiovascular examRhythm:Regular Rate:Normal     Neuro/Psych negative neurological ROS  negative psych ROS   GI/Hepatic negative GI ROS, Neg liver ROS,   Endo/Other  negative endocrine ROS  Renal/GU negative Renal ROS  negative genitourinary   Musculoskeletal negative musculoskeletal ROS (+)   Abdominal   Peds negative pediatric ROS (+)  Hematology negative hematology ROS (+)   Anesthesia Other Findings   Reproductive/Obstetrics negative OB ROS                             Anesthesia Physical Anesthesia Plan  ASA: II  Anesthesia Plan: MAC   Post-op Pain Management:    Induction:   Airway Management Planned: Natural Airway  Additional Equipment:   Intra-op Plan:   Post-operative Plan:   Informed Consent: I have reviewed the patients History and Physical, chart, labs and discussed the procedure including the risks, benefits and alternatives for the proposed anesthesia with the patient or authorized representative who has indicated his/her understanding and acceptance.   Dental advisory given  Plan Discussed with: CRNA  Anesthesia Plan Comments:         Anesthesia Quick Evaluation

## 2015-01-22 NOTE — Discharge Instructions (Signed)
Colonoscopy, Care After °Refer to this sheet in the next few weeks. These instructions provide you with information on caring for yourself after your procedure. Your health care provider may also give you more specific instructions. Your treatment has been planned according to current medical practices, but problems sometimes occur. Call your health care provider if you have any problems or questions after your procedure. °WHAT TO EXPECT AFTER THE PROCEDURE  °After your procedure, it is typical to have the following: °· A small amount of blood in your stool. °· Moderate amounts of gas and mild abdominal cramping or bloating. °HOME CARE INSTRUCTIONS °· Do not drive, operate machinery, or sign important documents for 24 hours. °· You may shower and resume your regular physical activities, but move at a slower pace for the first 24 hours. °· Take frequent rest periods for the first 24 hours. °· Walk around or put a warm pack on your abdomen to help reduce abdominal cramping and bloating. °· Drink enough fluids to keep your urine clear or pale yellow. °· You may resume your normal diet as instructed by your health care provider. Avoid heavy or fried foods that are hard to digest. °· Avoid drinking alcohol for 24 hours or as instructed by your health care provider. °· Only take over-the-counter or prescription medicines as directed by your health care provider. °· If a tissue sample (biopsy) was taken during your procedure: °· Do not take aspirin or blood thinners for 7 days, or as instructed by your health care provider. °· Do not drink alcohol for 7 days, or as instructed by your health care provider. °· Eat soft foods for the first 24 hours. °SEEK MEDICAL CARE IF: °You have persistent spotting of blood in your stool 2-3 days after the procedure. °SEEK IMMEDIATE MEDICAL CARE IF: °· You have more than a small spotting of blood in your stool. °· You pass large blood clots in your stool. °· Your abdomen is swollen  (distended). °· You have nausea or vomiting. °· You have a fever. °· You have increasing abdominal pain that is not relieved with medicine. °Document Released: 01/01/2004 Document Revised: 03/09/2013 Document Reviewed: 01/24/2013 °[employer]® Patient Information ©2015 [employer]. This information is not intended to replace advice given to you by your health care provider. Make sure you discuss any questions you have with your health care provider. ° °Monitored Anesthesia Care °Monitored anesthesia care is an anesthesia service for a medical procedure. Anesthesia is the loss of the ability to feel pain. It is produced by medicines called anesthetics. It may affect a small area of your body (local anesthesia), a large area of your body (regional anesthesia), or your entire body (general anesthesia). The need for monitored anesthesia care depends your procedure, your condition, and the potential need for regional or general anesthesia. It is often provided during procedures where:  °· General anesthesia may be needed if there are complications. This is because you need special care when you are under general anesthesia.   °· You will be under local or regional anesthesia. This is so that you are able to have higher levels of anesthesia if needed.   °· You will receive calming medicines (sedatives). This is especially the case if sedatives are given to put you in a semi-conscious state of relaxation (deep sedation). This is because the amount of sedative needed to produce this state can be hard to predict. Too much of a sedative can produce general anesthesia. °Monitored anesthesia care is performed by one   or more health care providers who have special training in all types of anesthesia. You will need to meet with these health care providers before your procedure. During this meeting, they will ask you about your medical history. They will also give you instructions to follow. (For example, you will need to stop  eating and drinking before your procedure. You may also need to stop or change medicines you are taking.) During your procedure, your health care providers will stay with you. They will:  °· Watch your condition. This includes watching your blood pressure, breathing, and level of pain.   °· Diagnose and treat problems that occur.   °· Give medicines if they are needed. These may include calming medicines (sedatives) and anesthetics.   °· Make sure you are comfortable.   °Having monitored anesthesia care does not necessarily mean that you will be under anesthesia. It does mean that your health care providers will be able to manage anesthesia if you need it or if it occurs. It also means that you will be able to have a different type of anesthesia than you are having if you need it. When your procedure is complete, your health care providers will continue to watch your condition. They will make sure any medicines wear off before you are allowed to go home.  °Document Released: 02/12/2005 Document Revised: 10/03/2013 Document Reviewed: 06/30/2012 °Exit[employer]® Patient Information ©2015 [employer]. This information is not intended to replace advice given to you by your health care provider. Make sure you discuss any questions you have with your health care provider. ° ° °

## 2015-01-22 NOTE — H&P (Signed)
  Procedure: Screening colonoscopy. Normal screening colonoscopy performed on 10/08/2004  History: The patient is a 64 year old male born 09/22/1950. He is scheduled to undergo a repeat screening colonoscopy today.  Medication allergies: Codeine  Past medical history: Hypertension. Hypercholesterolemia. Gastroesophageal reflux. Kidney stones. Cervical spondylosis. Left inguinal hernia repair. Pilonidal cyst resection.  Exam: The patient is alert and lying comfortably on the endoscopy stretcher. Abdomen is soft and nontender to palpation. Lungs are clear to auscultation. Cardiac exam reveals a regular rhythm.  Plan: Proceed with screening colonoscopy

## 2015-01-22 NOTE — Anesthesia Postprocedure Evaluation (Signed)
  Anesthesia Post-op Note  Patient: Dillon Arias  Procedure(s) Performed: Procedure(s) (LRB): COLONOSCOPY WITH PROPOFOL (N/A)  Patient Location: PACU  Anesthesia Type: MAC  Level of Consciousness: awake and alert   Airway and Oxygen Therapy: Patient Spontanous Breathing  Post-op Pain: mild  Post-op Assessment: Post-op Vital signs reviewed, Patient's Cardiovascular Status Stable, Respiratory Function Stable, Patent Airway and No signs of Nausea or vomiting  Last Vitals:  Filed Vitals:   01/22/15 1145  BP:   Pulse: 68  Temp:   Resp: 15    Post-op Vital Signs: stable   Complications: No apparent anesthesia complications

## 2015-01-22 NOTE — Op Note (Signed)
Procedure: Screening colonoscopy. Normal screening colonoscopy performed on 10/08/2004  Endoscopist: Earle Gell  Premedication: Propofol administered by anesthesia  Procedure: The patient was placed in the left lateral decubitus position. Anal inspection and digital rectal exam were normal. The Pentax pediatric colonoscope was introduced into the rectum and advanced to the cecum. A normal-appearing ileocecal valve and appendiceal orifice were identified. Colonic preparation for the exam today was good. Withdrawal time was 7 minutes  Rectum. Normal. Retroflex view of the distal rectum was normal  Sigmoid colon and descending colon. Left colonic diverticulosis  Splenic flexure. Normal  Transverse colon. Normal  Hepatic flexure. Normal  Ascending colon. Normal  Cecum and ileocecal valve. Normal  Assessment: Normal screening colonoscopy  Recommendation: Schedule repeat screening colonoscopy in 10 years

## 2015-01-23 ENCOUNTER — Encounter (HOSPITAL_COMMUNITY): Payer: Self-pay | Admitting: Gastroenterology

## 2015-06-18 MED FILL — PANTOPRAZOLE SOD DR 40 MG T: 40 | 90 days supply | Qty: 90 | Fill #2

## 2015-06-29 MED FILL — VALSARTAN 160 MG TABLET: 160 | 90 days supply | Qty: 90 | Fill #2

## 2015-06-29 MED FILL — HYDROCHLOROTHIAZIDE 12.5 MG: 12.5 | 90 days supply | Qty: 90 | Fill #2

## 2015-07-06 MED FILL — ATORVASTATIN 20 MG TABLET: 20 | 90 days supply | Qty: 90 | Fill #2

## 2015-08-23 MED FILL — SILDENAFIL 20 MG TABLET: 20 | 10 days supply | Qty: 30 | Fill #0

## 2015-09-04 MED FILL — PANTOPRAZOLE SOD DR 40 MG T: 40 | 90 days supply | Qty: 90 | Fill #0

## 2015-10-08 MED FILL — VALSARTAN 160 MG TABLET: 160 | 90 days supply | Qty: 90 | Fill #3

## 2015-10-08 MED FILL — ATORVASTATIN 20 MG TABLET: 20 | 90 days supply | Qty: 90 | Fill #3

## 2015-10-08 MED FILL — HYDROCHLOROTHIAZIDE 12.5 MG: 12.5 | 90 days supply | Qty: 90 | Fill #3

## 2015-10-10 DIAGNOSIS — H6980 Other specified disorders of Eustachian tube, unspecified ear: Secondary | ICD-10-CM | POA: Diagnosis not present

## 2015-11-03 ENCOUNTER — Encounter: Payer: Self-pay | Admitting: Emergency Medicine

## 2015-11-03 ENCOUNTER — Emergency Department (INDEPENDENT_AMBULATORY_CARE_PROVIDER_SITE_OTHER)
Admission: EM | Admit: 2015-11-03 | Discharge: 2015-11-03 | Disposition: A | Discharge status: Home / Self Care | Payer: Medicare Other | Source: Home / Self Care | Attending: Family Medicine | Admitting: Family Medicine

## 2015-11-03 DIAGNOSIS — J029 Acute pharyngitis, unspecified: Secondary | ICD-10-CM | POA: Diagnosis not present

## 2015-11-03 DIAGNOSIS — R0981 Nasal congestion: Secondary | ICD-10-CM

## 2015-11-03 DIAGNOSIS — R0982 Postnasal drip: Secondary | ICD-10-CM

## 2015-11-03 MED ORDER — PREDNISONE 20 MG PO TABS: ORAL_TABLET | ORAL | Status: DC

## 2015-11-03 MED ORDER — FLUTICASONE PROPIONATE 50 MCG/ACT NA SUSP
2.0000 | Freq: Every day | NASAL | Status: DC
Start: 2015-11-03 — End: 2021-09-04

## 2015-11-03 NOTE — ED Notes (Signed)
Pt c/o right ear pain, unable to sleep, nonproductive cough x 2-3 days, clear drainage, sore throat.

## 2015-11-03 NOTE — Discharge Instructions (Signed)
You may find relief using an over the counter sinus rinse and continuing to gargle salt water or drink warm drinks such as tea to help thin mucous in your throat.  If you develop a fever, worsening congestion and sinus pain, green mucous, or other new concerning symptoms, please call the office and we can call in an antibiotic for you.

## 2015-11-03 NOTE — ED Provider Notes (Signed)
CSN: [id number]     Arrival date & time 11/03/15  0911 History   First MD Initiated Contact with Patient 11/03/15 [phone]     Chief Complaint  Patient presents with  . URI   (Consider location/radiation/quality/duration/timing/severity/associated sxs/prior Treatment) HPI  The pt is a 65yo male presenting to [facility] with c/o Right ear pain and moderately intermittent non-productive cough for 2-3 days.  Pt presenting today with reports of sensation of a "mucous plug" being stuck in the back of his throat that kept him up last night.  He has tried gargling salt water, coughing and blowing his nose w/o relief. He reports the mucous he does get up is clear.  He did have similar ear pain a few weeks ago and was prescribed Flonase for 2 weeks and Alegra D, symptoms did seem to resolve until a few days ago. Denies fever, chills, n/v/d.  Past Medical History  Diagnosis Date  . Hypertension   . Hyperlipidemia   . GERD (gastroesophageal reflux disease)   . H/O renal calculi    Past Surgical History  Procedure Laterality Date  . Kidney stone surgery    . Pilonidal cyst excision    . Ptosis repair    . Extracorporeal shock wave lithotripsy    . Hernia repair      Left  . Colonoscopy with propofol N/A 01/22/2015    Procedure: COLONOSCOPY WITH PROPOFOL;  Surgeon: Garlan Fair, MD;  Location: [facility];  Service: Endoscopy;  Laterality: N/A;   Family History  Problem Relation Age of Onset  . Hypertension Mother   . Nephrolithiasis Mother   . Hypertension Father   . Diabetes Father   . Nephrolithiasis Father    Social History  Substance Use Topics  . Smoking status: Never Smoker   . Smokeless tobacco: Never Used  . Alcohol Use: No    Review of Systems  Constitutional: Negative for fever and chills.  HENT: Positive for congestion, ear pain (Right) and sore throat ( mild irritation). Negative for trouble swallowing and voice change.   Respiratory: Positive for cough. Negative for shortness  of breath.   Cardiovascular: Negative for chest pain and palpitations.  Gastrointestinal: Negative for nausea, vomiting, abdominal pain and diarrhea.  Musculoskeletal: Negative for myalgias, back pain and arthralgias.  Skin: Negative for rash.    Allergies  Codeine  Home Medications   Prior to Admission medications   Medication Sig Start Date End Date Taking? Authorizing Provider  aspirin 81 MG tablet Take 81 mg by mouth every other day.     Historical Provider, MD  atorvastatin (LIPITOR) 10 MG tablet Take 10 mg by mouth daily.    Historical Provider, MD  cholecalciferol (VITAMIN D) 1000 UNITS tablet Take 1,000 Units by mouth daily.    Historical Provider, MD  fluticasone (FLONASE) 50 MCG/ACT nasal spray Place 2 sprays into both nostrils daily. 11/03/15   Noland Fordyce, PA-C  Multiple Vitamin (MULTIVITAMIN WITH MINERALS) TABS tablet Take 1 tablet by mouth daily.    Historical Provider, MD  pantoprazole (PROTONIX) 20 MG tablet Take 20 mg by mouth daily.    Historical Provider, MD  predniSONE (DELTASONE) 20 MG tablet 3 tabs po day one, then 2 po daily x 4 days 11/03/15   Noland Fordyce, PA-C  valsartan-hydrochlorothiazide (DIOVAN-HCT) 160-12.5 MG per tablet Take 1 tablet by mouth daily.    Historical Provider, MD   Meds Ordered and Administered this Visit  Medications - No data to display  BP 146/86 mmHg  Pulse 83  Temp(Src) 98.4 F (36.9 C) (Oral)  Ht 5\' 7"  (1.702 m)  Wt 219 lb (99.338 kg)  BMI 34.29 kg/m2  SpO2 94% No data found.   Physical Exam  Constitutional: He appears well-developed and well-nourished.  HENT:  Head: Normocephalic and atraumatic.  Right Ear: Tympanic membrane normal.  Left Ear: Tympanic membrane normal.  Nose: Nose normal. Right sinus exhibits no maxillary sinus tenderness and no frontal sinus tenderness. Left sinus exhibits no maxillary sinus tenderness and no frontal sinus tenderness.  Mouth/Throat: Uvula is midline, oropharynx is clear and moist and  mucous membranes are normal.  Thick white post-nasal drip noted on exam. No evidence of abscess or uvula swelling. No visible airway obstruction. Pt able to manage his own secretions.   Eyes: Conjunctivae are normal. No scleral icterus.  Neck: Normal range of motion. Neck supple.  Cardiovascular: Normal rate, regular rhythm and normal heart sounds.   Pulmonary/Chest: Effort normal and breath sounds normal. No stridor. No respiratory distress. He has no wheezes. He has no rales.  Musculoskeletal: Normal range of motion.  Lymphadenopathy:    He has no cervical adenopathy.  Neurological: He is alert.  Skin: Skin is warm and dry.  Nursing note and vitals reviewed.   ED Course  Procedures (including critical care time)  Labs Review Labs Reviewed - No data to display  Imaging Review No results found.    MDM   1. Sore throat   2. Post-nasal drip   3. Sinus congestion    Exam c/w post-nasal drip as cause of throat irritation. No evidence of bacterial infection on exam.  Discussed treatment options. Will restart pt on Flonase and prescribe prednisone dose pack.  Encouraged sinus rinses and continue gargling saltwater and drinking warm liquids such as tea.   F/u with PCP in 1 week if not improving, sooner if worsening. Patient verbalized understanding and agreement with treatment plan.     Noland Fordyce, PA-C 11/03/15 1656

## 2015-11-06 ENCOUNTER — Emergency Department (HOSPITAL_COMMUNITY): Payer: Medicare Other

## 2015-11-06 ENCOUNTER — Encounter (HOSPITAL_COMMUNITY): Payer: Self-pay | Admitting: Emergency Medicine

## 2015-11-06 ENCOUNTER — Inpatient Hospital Stay (HOSPITAL_COMMUNITY)
Admission: EM | Admit: 2015-11-06 | Discharge: 2015-11-07 | DRG: 153 | Disposition: A | Discharge status: Home / Self Care | Payer: Medicare Other | Attending: Internal Medicine | Admitting: Internal Medicine

## 2015-11-06 DIAGNOSIS — Q892 Congenital malformations of other endocrine glands: Secondary | ICD-10-CM | POA: Diagnosis not present

## 2015-11-06 DIAGNOSIS — E0789 Other specified disorders of thyroid: Secondary | ICD-10-CM | POA: Diagnosis not present

## 2015-11-06 DIAGNOSIS — Z7951 Long term (current) use of inhaled steroids: Secondary | ICD-10-CM

## 2015-11-06 DIAGNOSIS — K219 Gastro-esophageal reflux disease without esophagitis: Secondary | ICD-10-CM | POA: Diagnosis present

## 2015-11-06 DIAGNOSIS — Z833 Family history of diabetes mellitus: Secondary | ICD-10-CM

## 2015-11-06 DIAGNOSIS — Z885 Allergy status to narcotic agent status: Secondary | ICD-10-CM

## 2015-11-06 DIAGNOSIS — Z888 Allergy status to other drugs, medicaments and biological substances status: Secondary | ICD-10-CM

## 2015-11-06 DIAGNOSIS — Z7952 Long term (current) use of systemic steroids: Secondary | ICD-10-CM

## 2015-11-06 DIAGNOSIS — J0510 Acute epiglottitis without obstruction: Secondary | ICD-10-CM | POA: Diagnosis not present

## 2015-11-06 DIAGNOSIS — K148 Other diseases of tongue: Secondary | ICD-10-CM | POA: Diagnosis present

## 2015-11-06 DIAGNOSIS — Z8249 Family history of ischemic heart disease and other diseases of the circulatory system: Secondary | ICD-10-CM

## 2015-11-06 DIAGNOSIS — J029 Acute pharyngitis, unspecified: Secondary | ICD-10-CM | POA: Diagnosis not present

## 2015-11-06 DIAGNOSIS — K1379 Other lesions of oral mucosa: Secondary | ICD-10-CM | POA: Diagnosis not present

## 2015-11-06 DIAGNOSIS — Z7982 Long term (current) use of aspirin: Secondary | ICD-10-CM | POA: Diagnosis not present

## 2015-11-06 DIAGNOSIS — R0982 Postnasal drip: Secondary | ICD-10-CM | POA: Diagnosis not present

## 2015-11-06 DIAGNOSIS — E785 Hyperlipidemia, unspecified: Secondary | ICD-10-CM | POA: Diagnosis present

## 2015-11-06 DIAGNOSIS — I10 Essential (primary) hypertension: Secondary | ICD-10-CM | POA: Diagnosis present

## 2015-11-06 DIAGNOSIS — J387 Other diseases of larynx: Secondary | ICD-10-CM | POA: Diagnosis present

## 2015-11-06 DIAGNOSIS — Z79899 Other long term (current) drug therapy: Secondary | ICD-10-CM

## 2015-11-06 DIAGNOSIS — R221 Localized swelling, mass and lump, neck: Secondary | ICD-10-CM | POA: Diagnosis not present

## 2015-11-06 LAB — CBC WITH DIFFERENTIAL/PLATELET
BASOS PCT: 0 %
Basophils Absolute: 0 10*3/uL (ref 0.0–0.1)
Eosinophils Absolute: 0.1 10*3/uL (ref 0.0–0.7)
Eosinophils Relative: 0 %
HEMATOCRIT: 44.7 % (ref 39.0–52.0)
Hemoglobin: 15.4 g/dL (ref 13.0–17.0)
LYMPHS ABS: 1.5 10*3/uL (ref 0.7–4.0)
Lymphocytes Relative: 8 %
MCH: 31.4 pg (ref 26.0–34.0)
MCHC: 34.5 g/dL (ref 30.0–36.0)
MCV: 91.2 fL (ref 78.0–100.0)
MONO ABS: 1.8 10*3/uL — AB (ref 0.1–1.0)
MONOS PCT: 9 %
NEUTROS ABS: 15.8 10*3/uL — AB (ref 1.7–7.7)
Neutrophils Relative %: 83 %
Platelets: 244 10*3/uL (ref 150–400)
RBC: 4.9 MIL/uL (ref 4.22–5.81)
RDW: 13.2 % (ref 11.5–15.5)
WBC: 19.2 10*3/uL — ABNORMAL HIGH (ref 4.0–10.5)

## 2015-11-06 LAB — BASIC METABOLIC PANEL
ANION GAP: 11 (ref 5–15)
BUN: 18 mg/dL (ref 6–20)
CALCIUM: 8.8 mg/dL — AB (ref 8.9–10.3)
CO2: 24 mmol/L (ref 22–32)
CREATININE: 0.76 mg/dL (ref 0.61–1.24)
Chloride: 102 mmol/L (ref 101–111)
GFR calc non Af Amer: >60 mL/min (ref >60)
GLUCOSE: 132 mg/dL — AB (ref 65–99)
Potassium: 3.7 mmol/L (ref 3.5–5.1)
SODIUM: 137 mmol/L (ref 135–145)

## 2015-11-06 LAB — MRSA PCR SCREENING: MRSA by PCR: NEGATIVE

## 2015-11-06 MED ORDER — FLUTICASONE PROPIONATE 50 MCG/ACT NA SUSP
2.0000 | Freq: Every day | NASAL | Status: DC
  Administered 2015-11-06: 2 via NASAL
  Filled 2015-11-06 (×2): qty 16

## 2015-11-06 MED ORDER — ATORVASTATIN CALCIUM 10 MG PO TABS
10.0000 mg | ORAL_TABLET | Freq: Every day | ORAL | Status: DC
  Administered 2015-11-06: 10 mg via ORAL
  Filled 2015-11-06: qty 1

## 2015-11-06 MED ORDER — ASPIRIN 81 MG PO CHEW
81.0000 mg | CHEWABLE_TABLET | ORAL | Status: DC
  Administered 2015-11-06: 81 mg via ORAL
  Filled 2015-11-06: qty 1

## 2015-11-06 MED ORDER — ADULT MULTIVITAMIN W/MINERALS CH
1.0000 | ORAL_TABLET | Freq: Every day | ORAL | Status: DC
  Filled 2015-11-06: qty 1

## 2015-11-06 MED ORDER — IRBESARTAN 150 MG PO TABS
150.0000 mg | ORAL_TABLET | Freq: Every day | ORAL | Status: DC
  Filled 2015-11-06: qty 1

## 2015-11-06 MED ORDER — ENOXAPARIN SODIUM 40 MG/0.4ML ~~LOC~~ SOLN: 40.0000 mg | SUBCUTANEOUS | Status: DC

## 2015-11-06 MED ORDER — SODIUM CHLORIDE 0.9 % IV SOLN
1250.0000 mg | Freq: Once | INTRAVENOUS | Status: AC
  Administered 2015-11-06: 1250 mg via INTRAVENOUS
  Filled 2015-11-06: qty 1250

## 2015-11-06 MED ORDER — METHYLPREDNISOLONE SODIUM SUCC 125 MG IJ SOLR
INTRAMUSCULAR | Status: AC
  Filled 2015-11-06: qty 2

## 2015-11-06 MED ORDER — PANTOPRAZOLE SODIUM 20 MG PO TBEC
20.0000 mg | DELAYED_RELEASE_TABLET | Freq: Every day | ORAL | Status: DC
  Filled 2015-11-06: qty 1

## 2015-11-06 MED ORDER — METHYLPREDNISOLONE SODIUM SUCC 125 MG IJ SOLR
60.0000 mg | Freq: Two times a day (BID) | INTRAMUSCULAR | Status: DC
  Administered 2015-11-06 (×2): 60 mg via INTRAVENOUS
  Filled 2015-11-06 (×2): qty 2

## 2015-11-06 MED ORDER — DEXTROSE 5 % IV SOLN
1.0000 g | INTRAVENOUS | Status: DC
  Administered 2015-11-07: 1 g via INTRAVENOUS
  Filled 2015-11-06: qty 10

## 2015-11-06 MED ORDER — CHLORHEXIDINE GLUCONATE 0.12 % MT SOLN
15.0000 mL | Freq: Two times a day (BID) | OROMUCOSAL | Status: DC
  Administered 2015-11-06 (×2): 15 mL via OROMUCOSAL
  Filled 2015-11-06 (×2): qty 15

## 2015-11-06 MED ORDER — VANCOMYCIN HCL 10 G IV SOLR
1250.0000 mg | Freq: Two times a day (BID) | INTRAVENOUS | Status: DC
  Administered 2015-11-06 – 2015-11-07 (×2): 1250 mg via INTRAVENOUS
  Filled 2015-11-06 (×2): qty 1250

## 2015-11-06 MED ORDER — DEXTROSE 5 % IV SOLN
2.0000 g | Freq: Once | INTRAVENOUS | Status: AC
  Administered 2015-11-06: 2 g via INTRAVENOUS
  Filled 2015-11-06: qty 2

## 2015-11-06 MED ORDER — HYDROCHLOROTHIAZIDE 12.5 MG PO CAPS
12.5000 mg | ORAL_CAPSULE | Freq: Every day | ORAL | Status: DC
  Filled 2015-11-06: qty 1

## 2015-11-06 MED ORDER — CETYLPYRIDINIUM CHLORIDE 0.05 % MT LIQD
7.0000 mL | Freq: Two times a day (BID) | OROMUCOSAL | Status: DC
  Administered 2015-11-06 (×2): 7 mL via OROMUCOSAL

## 2015-11-06 MED ORDER — IOPAMIDOL (ISOVUE-300) INJECTION 61%
75.0000 mL | Freq: Once | INTRAVENOUS | Status: AC | PRN
  Administered 2015-11-06: 75 mL via INTRAVENOUS

## 2015-11-06 MED ORDER — ASPIRIN 81 MG PO TABS: 81.0000 mg | ORAL_TABLET | ORAL | Status: DC

## 2015-11-06 MED ORDER — VITAMIN D3 25 MCG (1000 UNIT) PO TABS
1000.0000 [IU] | ORAL_TABLET | Freq: Every day | ORAL | Status: DC
  Administered 2015-11-06: 1000 [IU] via ORAL
  Filled 2015-11-06 (×3): qty 1

## 2015-11-06 NOTE — Progress Notes (Signed)
Pt requested to have SCDs instead of Lovenox. MD paged and also asked to see if pt could have tablets with sips prior to ENT consult and MD agreed.

## 2015-11-06 NOTE — ED Notes (Signed)
Pt states he was seen at Urgent Care in [place] on Saturday and they told him to take allegra and put him on a steroid dose pack because he was c/o sore throat, cough, and earache  Pt states he took his third dose of the steroid dose pack about an hour ago  Pt states his uvula is swollen and when he lays down he gets short of breath  Pt clearing his throat several times during triage  Pt states he has been using saline washes for his sinuses  Pt states all his mucous has been clear in color and he has not had a fever

## 2015-11-06 NOTE — ED Notes (Signed)
Was unable to draw labs - Pt went upstairs

## 2015-11-06 NOTE — ED Notes (Signed)
Consult MD at bedside.

## 2015-11-06 NOTE — H&P (Signed)
History and Physical  TAVI ULMAN [id number] DOB: Jan 21, 1951 DOA: 11/06/2015  Referring physician:  PCP: Henrine Screws, MD  Outpatient Specialists:  Patient coming from: Home  Chief Complaint - Throat discomfort  HPI: 65 year old male with history of Hypertension and hyperlipidemia. Patient was seen alongside patient's wife. Patient tells me that his problems started about 4 weeks ago with bilateral ear ache, worse on the right side with associated tinnitus. Patient was seen by the PCP and prescribed allegra D, Flonase and Steroids after a negative examination. Patient relatively well until about 3 days ago when he developed what seems like post nasal drip syndrome. Problem became worse 2 days ago as he found difficult lying down at night time, with associated coughing, hoarseness of the speech and was noted to have enlarged uvular and whitish mucus around the upper respiratory area at an Urgent center according to patient. No associated fever or chills. Patient was started on prednisone at the Urgent Care center. Patient presents today with worsening symptoms and CT of the neck revealed epiglotittis with possible abscess as per the Radiologist (I have not visualized official report). Patient will be continued on IV Vancomycin and Rocephin. ENT has been consulted.    ED Course: Started on IV Rocephin and Vancomycin at the ER. CT scan of the neck also ordered by the ER physician. Pertinent labs: CT NECK. Leukocytosis, but patient was on prednisone prior to admission.   Review of Systems:  As in HPI, otherwise, 12 points were reviewed and non revealing. Negative for fever, visual changes, sore throat, rash, new muscle aches, chest pain, SOB, dysuria, bleeding, n/v/abdominal pain.  Past Medical History  Diagnosis Date  . Hypertension   . Hyperlipidemia   . GERD (gastroesophageal reflux disease)   . H/O renal calculi     Past Surgical History  Procedure Laterality Date  .  Kidney stone surgery    . Pilonidal cyst excision    . Ptosis repair    . Extracorporeal shock wave lithotripsy    . Hernia repair      Left  . Colonoscopy with propofol N/A 01/22/2015    Procedure: COLONOSCOPY WITH PROPOFOL;  Surgeon: Garlan Fair, MD;  Location: [facility];  Service: Endoscopy;  Laterality: N/A;     reports that he has never smoked. He has never used smokeless tobacco. He reports that he does not drink alcohol or use illicit drugs.  Allergies  Allergen Reactions  . Codeine Other (See Comments)    dysphoric   . Epinephrine Other (See Comments)    tachycardia    Family History  Problem Relation Age of Onset  . Hypertension Mother   . Nephrolithiasis Mother   . Hypertension Father   . Diabetes Father   . Nephrolithiasis Father      Prior to Admission medications   Medication Sig Start Date End Date Taking? Authorizing Provider  aspirin 81 MG tablet Take 81 mg by mouth every other day.    Yes Historical Provider, MD  atorvastatin (LIPITOR) 10 MG tablet Take 10 mg by mouth daily.   Yes Historical Provider, MD  cholecalciferol (VITAMIN D) 1000 UNITS tablet Take 1,000 Units by mouth daily.   Yes Historical Provider, MD  fluticasone (FLONASE) 50 MCG/ACT nasal spray Place 2 sprays into both nostrils daily. 11/03/15  Yes Noland Fordyce, PA-C  hydrochlorothiazide (MICROZIDE) 12.5 MG capsule Take 1 capsule by mouth daily. 10/08/15  Yes Historical Provider, MD  Multiple Vitamin (MULTIVITAMIN WITH MINERALS) TABS tablet  Take 1 tablet by mouth daily.   Yes Historical Provider, MD  pantoprazole (PROTONIX) 20 MG tablet Take 20 mg by mouth daily.   Yes Historical Provider, MD  predniSONE (DELTASONE) 20 MG tablet 3 tabs po day one, then 2 po daily x 4 days 11/03/15  Yes Noland Fordyce, PA-C  sildenafil (REVATIO) 20 MG tablet Take 1 tablet by mouth as needed. Erectile dysfunction 08/23/15  Yes Historical Provider, MD  valsartan (DIOVAN) 160 MG tablet Take 1 tablet by mouth daily.  10/08/15  Yes Historical Provider, MD    Physical Exam: Filed Vitals:   11/06/15 [id number] 11/06/15 0658 11/06/15 0850 11/06/15 0923  BP: 149/87 151/86 144/83 164/87  Pulse: 88 93 92 113  Temp: 98.4 F (36.9 C)     TempSrc: Oral     Resp: 17 17 18 18   SpO2: 95% 96% 94% 97%     Constitutional:  . Appears calm and comfortable. Obese. Not in respiratory distress. Eyes:  . PERRL and irises appear normal . Normal conjunctivae and lids ENMT:  . external ears, nose appear normal . grossly normal hearing, hard of hearing, deaf . Oropharynx: mucosa, tongue,posterior pharynx appear normal Neck:  . neck appears normal, no masses, normal ROM, supple . no thyromegaly Respiratory:  . CTA bilaterally, no w/r/r.  . Respiratory effort normal. No retractions or accessory muscle use Cardiovascular:  . RRR, no m/r/g . No LE extremity edema   . Normal pedal pulses Abdomen:  . Abdomen is obese; no tenderness or masses  Musculoskeletal:  . Digits/nails: no clubbing, cyanosis,   . exam of joints, bones, muscles of at least one of following: head/neck, RUE, LUE, RLE, LLE   o strength and tone normal, no atrophy, no abnormal movements o No tenderness, masses o Normal ROM, no contractures   Skin:  . No rashes   Neurologic:  . CN 2-12 intact . Sensation all 4 extremities intact Psychiatric:  . judgement and insight appear normal . Mental status o Orientation to person, place, time  o Mood, affect appropriate  Wt Readings from Last 3 Encounters:  11/03/15 99.338 kg (219 lb)  01/22/15 92.987 kg (205 lb)  11/23/13 98.431 kg (217 lb)    I have personally reviewed following labs and imaging studies  Labs on Admission:  CBC:  Recent Labs Lab 11/06/15 0606  WBC 19.2*  NEUTROABS 15.8*  HGB 15.4  HCT 44.7  MCV 91.2  PLT [id number]   Basic Metabolic Panel:  Recent Labs Lab 11/06/15 0606  NA 137  K 3.7  CL 102  CO2 24  GLUCOSE 132*  BUN 18  CREATININE 0.76  CALCIUM 8.8*   Liver  Function Tests: No results for input(s): AST, ALT, ALKPHOS, BILITOT, PROT, ALBUMIN in the last 168 hours. No results for input(s): LIPASE, AMYLASE in the last 168 hours. No results for input(s): AMMONIA in the last 168 hours. Coagulation Profile: No results for input(s): INR, PROTIME in the last 168 hours. Cardiac Enzymes: No results for input(s): CKTOTAL, CKMB, CKMBINDEX, TROPONINI in the last 168 hours. BNP (last 3 results) No results for input(s): PROBNP in the last 8760 hours. HbA1C: No results for input(s): HGBA1C in the last 72 hours. CBG: No results for input(s): GLUCAP in the last 168 hours. Lipid Profile: No results for input(s): CHOL, HDL, LDLCALC, TRIG, CHOLHDL, LDLDIRECT in the last 72 hours. Thyroid Function Tests: No results for input(s): TSH, T4TOTAL, FREET4, T3FREE, THYROIDAB in the last 72 hours. Anemia Panel: No results for input(s):  VITAMINB12, FOLATE, FERRITIN, TIBC, IRON, RETICCTPCT in the last 72 hours. Urine analysis:    Component Value Date/Time   COLORURINE YELLOW 10/21/2014 1210   APPEARANCEUR CLEAR 10/21/2014 1210   LABSPEC 1.036* 10/21/2014 1210   PHURINE 5.5 10/21/2014 1210   GLUCOSEU NEGATIVE 10/21/2014 1210   HGBUR NEGATIVE 10/21/2014 1210   BILIRUBINUR NEGATIVE 10/21/2014 1210   BILIRUBINUR negative 08/10/2013 1258   KETONESUR NEGATIVE 10/21/2014 1210   KETONESUR negative 08/10/2013 1258   PROTEINUR NEGATIVE 10/21/2014 1210   PROTEINUR negative 08/10/2013 1258   UROBILINOGEN 0.2 10/21/2014 1210   UROBILINOGEN 0.2 08/10/2013 1258   NITRITE NEGATIVE 10/21/2014 1210   NITRITE Negative 08/10/2013 1258   LEUKOCYTESUR NEGATIVE 10/21/2014 1210   Sepsis Labs: @LABRCNTIP (procalcitonin:4,lacticidven:4) )No results found for this or any previous visit (from the past 240 hour(s)).    Radiological Exams on Admission: Dg Neck Soft Tissue  11/06/2015  CLINICAL DATA:  Initial evaluation for acute sore throat. EXAM: NECK SOFT TISSUES - 1+ VIEW COMPARISON:   Prior study from 10/16/2009. FINDINGS: Epiglottis is somewhat thickened and edematous in appearance, suspicious for possible acute epiglottitis. No retropharyngeal soft tissue swelling or edema. Supraglottic and subglottic airway is are patent. No radiopaque foreign body. Multilevel degenerative spondylolysis within the visualized spine, most severe at C5-6. Visualized lung apices are clear. IMPRESSION: Thickening/ thumb printing of the epiglottis, suspicious for acute epiglottitis. Electronically Signed   By: Jeannine Boga M.D.   On: 11/06/2015 05:31   Ct Soft Tissue Neck W Contrast  11/06/2015  CLINICAL DATA:  65 year old hypertensive male presenting with sore throat, shortness breath, ear aching and cough since Friday. Treated with Dosepak. Swollen uvula. Subsequent encounter. EXAM: CT NECK WITH CONTRAST TECHNIQUE: Multidetector CT imaging of the neck was performed using the standard protocol following the bolus administration of intravenous contrast. CONTRAST:  49mL ISOVUE-300 IOPAMIDOL (ISOVUE-300) INJECTION 61% COMPARISON:  None. FINDINGS: Pharynx and larynx: Swollen epiglottis. Anterior inferior to the swollen epiglottis is a 1.4 cm ill-defined collections suspicious for an abscess located superior posterior to the hyoid bone at the base of the tongue/epiglottis interface. Infected thyroglossal duct cyst is a consideration although this may reflect inflammatory process unrelated to thyroglossal duct cyst. This will need to be followed until clearance to exclude underlying malignancy. Mild inflammation extends to the glottic region with slight infiltration of surrounding fat planes. Tiny gas collection left lateral aspect and left posterior lateral aspect of the supraglottic region. Communication with air column not visualized. Small dissecting laryngoceles possible versus result of infection. Salivary glands: No primary salivary gland abnormality. Thyroid: No primary thyroid abnormality. Lymph  nodes: Scattered lymph nodes some of which appear slightly rounded left level 2 and right level 3 possibly reactive in origin but will need to be followed if the patient does not respond to treatment to exclude possibly malignancy. Vascular: Scattered calcified plaque (right carotid bifurcation, distal left internal carotid artery proximal to the skullbase and cavernous segment internal carotid artery bilaterally). No evidence of septic thrombophlebitis. Limited intracranial: Negative. Visualized orbits: Negative. Mastoids and visualized paranasal sinuses: Maxillary sinus mucosal thickening measuring up to 4 mm on right and 3 mm on the left. Skeleton: Cervical spondylotic changes with various degrees of spinal stenosis and foraminal narrowing most prominent C3-4 thru C6-7. Upper chest: No worrisome abnormality. IMPRESSION: Swollen epiglottis consistent with epiglottitis. Anterior inferior to the swollen epiglottis is a 1.4 cm ill-defined collections suspicious for an abscess located superior posterior to the hyoid bone at the base of the tongue/epiglottis interface. Infected  thyroglossal duct cyst is a consideration although this may reflect inflammatory process unrelated to thyroglossal duct cyst. This will need to be followed until clearance to exclude underlying malignancy (does not appear to be case clinically). Mild inflammation extends to the glottic region with slight infiltration of surrounding fat planes. Tiny gas collection left lateral aspect and left posterior lateral aspect of the supraglottic region. Communication with air column not visualized. Small dissecting laryngoceles possible versus result of infection. Slightly rounded level 2 and level 3 lymph nodes. These results were called by telephone at the time of interpretation on 11/06/2015 at 8:49 am to Dr. Marthenia Rolling, who verbally acknowledged these results. Electronically Signed   By: Genia Del M.D.   On: 11/06/2015 09:11      Active Problems:    Acute epiglottiditis   Assessment/Plan 1. Acute Epiglottiditis with possible abscess 2. Hypertension 3. Hyperlipidemia   Admit to step down unit  Consult ENT (Discussed with Dr. Melissa Montane)  Continue IV antibiotics  Change steroids to IV  Optimize BP control  Continue statins  DVT prophylaxis: Subcutaneous Lovenox (patient is said to have refused, and asked for SCD. Patient is a retired Immunologist) Code Status: Full. Family Communication: Wife. Disposition Plan: Home eventually   Consults called: ENT (Dr. Melissa Montane). Dr. Janace Hoard advised that the patient should be transferred to [facility].   Admission status: Inpatient    Time spent: Greater than 60 minutes  Dana Allan, MD  [facility] Pager #: [phone] 7PM-7AM contact night coverage as above  11/06/2015, 9:57 AM

## 2015-11-06 NOTE — ED Provider Notes (Signed)
CSN: [id number]     Arrival date & time 11/06/15  0413 History   First MD Initiated Contact with Patient 11/06/15 0459     Chief Complaint  Patient presents with  . Throat Discomfort      (Consider location/radiation/quality/duration/timing/severity/associated sxs/prior Treatment) HPI  This is a 65 year old male who was treated about a month ago for upper respiratory symptoms. He was treated with steroids and Allegra-D for nasal congestion, sinus pressure, sore throat, cough and earache. His symptoms worsened several days ago and he was restarted on prednisone and Allegra-D. He has taken 3 days worth of his prednisone. He is here due to throat discomfort and swollen uvula which are making it difficult for him to sleep especially when supine. Lying back causes him to gag. There is some mild dysphonia associated with it as well as the sensation of postnasal drip. He has not had a fever. He has also been using salt water gargles for his throat and saline rinses for his sinuses.  Past Medical History  Diagnosis Date  . Hypertension   . Hyperlipidemia   . GERD (gastroesophageal reflux disease)   . H/O renal calculi    Past Surgical History  Procedure Laterality Date  . Kidney stone surgery    . Pilonidal cyst excision    . Ptosis repair    . Extracorporeal shock wave lithotripsy    . Hernia repair      Left  . Colonoscopy with propofol N/A 01/22/2015    Procedure: COLONOSCOPY WITH PROPOFOL;  Surgeon: Garlan Fair, MD;  Location: [facility];  Service: Endoscopy;  Laterality: N/A;   Family History  Problem Relation Age of Onset  . Hypertension Mother   . Nephrolithiasis Mother   . Hypertension Father   . Diabetes Father   . Nephrolithiasis Father    Social History  Substance Use Topics  . Smoking status: Never Smoker   . Smokeless tobacco: Never Used  . Alcohol Use: No    Review of Systems  All other systems reviewed and are negative.   Allergies  Codeine  Home  Medications   Prior to Admission medications   Medication Sig Start Date End Date Taking? Authorizing Provider  aspirin 81 MG tablet Take 81 mg by mouth every other day.    Yes Historical Provider, MD  atorvastatin (LIPITOR) 10 MG tablet Take 10 mg by mouth daily.   Yes Historical Provider, MD  cholecalciferol (VITAMIN D) 1000 UNITS tablet Take 1,000 Units by mouth daily.   Yes Historical Provider, MD  fluticasone (FLONASE) 50 MCG/ACT nasal spray Place 2 sprays into both nostrils daily. 11/03/15  Yes Noland Fordyce, PA-C  hydrochlorothiazide (MICROZIDE) 12.5 MG capsule Take 1 capsule by mouth daily. 10/08/15  Yes Historical Provider, MD  Multiple Vitamin (MULTIVITAMIN WITH MINERALS) TABS tablet Take 1 tablet by mouth daily.   Yes Historical Provider, MD  pantoprazole (PROTONIX) 20 MG tablet Take 20 mg by mouth daily.   Yes Historical Provider, MD  predniSONE (DELTASONE) 20 MG tablet 3 tabs po day one, then 2 po daily x 4 days 11/03/15  Yes Noland Fordyce, PA-C  sildenafil (REVATIO) 20 MG tablet Take 1 tablet by mouth as needed. Erectile dysfunction 08/23/15  Yes Historical Provider, MD  valsartan (DIOVAN) 160 MG tablet Take 1 tablet by mouth daily. 10/08/15  Yes Historical Provider, MD   BP 149/87 mmHg  Pulse 88  Temp(Src) 98.4 F (36.9 C) (Oral)  Resp 17  SpO2 95%   Physical Exam  General: Well-developed, well-nourished male in no acute distress; appearance consistent with age of record HENT: normocephalic; atraumatic; no pharyngeal erythema or exudate; mild edema of the uvula; mild dysphonia; no trismus; no stridor Eyes: pupils equal, round and reactive to light; extraocular muscles intact Neck: supple Heart: regular rate and rhythm Lungs: clear to auscultation bilaterally Abdomen: soft; nondistended; nontender; no masses or hepatosplenomegaly; bowel sounds present Extremities: No deformity; full range of motion; pulses normal Neurologic: Awake, alert and oriented; motor function intact in all  extremities and symmetric; no facial droop Skin: Warm and dry Psychiatric: Normal mood and affect    ED Course  Procedures (including critical care time)   MDM   Nursing notes and vitals signs, including pulse oximetry, reviewed.  Summary of this visit's results, reviewed by myself:  Labs:  Results for orders placed or performed during the hospital encounter of 11/06/15 (from the past 24 hour(s))  Basic metabolic panel     Status: Abnormal   Collection Time: 11/06/15  6:06 AM  Result Value Ref Range   Sodium 137 135 - 145 mmol/L   Potassium 3.7 3.5 - 5.1 mmol/L   Chloride 102 101 - 111 mmol/L   CO2 24 22 - 32 mmol/L   Glucose, Bld 132 (H) 65 - 99 mg/dL   BUN 18 6 - 20 mg/dL   Creatinine, Ser 0.76 0.61 - 1.24 mg/dL   Calcium 8.8 (L) 8.9 - 10.3 mg/dL   GFR calc non Af Amer >60 >60 mL/min   GFR calc Af Amer >60 >60 mL/min   Anion gap 11 5 - 15  CBC with Differential/Platelet     Status: Abnormal   Collection Time: 11/06/15  6:06 AM  Result Value Ref Range   WBC 19.2 (H) 4.0 - 10.5 K/uL   RBC 4.90 4.22 - 5.81 MIL/uL   Hemoglobin 15.4 13.0 - 17.0 g/dL   HCT 44.7 39.0 - 52.0 %   MCV 91.2 78.0 - 100.0 fL   MCH 31.4 26.0 - 34.0 pg   MCHC 34.5 30.0 - 36.0 g/dL   RDW 13.2 11.5 - 15.5 %   Platelets 244 150 - 400 K/uL   Neutrophils Relative % 83 %   Neutro Abs 15.8 (H) 1.7 - 7.7 K/uL   Lymphocytes Relative 8 %   Lymphs Abs 1.5 0.7 - 4.0 K/uL   Monocytes Relative 9 %   Monocytes Absolute 1.8 (H) 0.1 - 1.0 K/uL   Eosinophils Relative 0 %   Eosinophils Absolute 0.1 0.0 - 0.7 K/uL   Basophils Relative 0 %   Basophils Absolute 0.0 0.0 - 0.1 K/uL    Imaging Studies: Dg Neck Soft Tissue  11/06/2015  CLINICAL DATA:  Initial evaluation for acute sore throat. EXAM: NECK SOFT TISSUES - 1+ VIEW COMPARISON:  Prior study from 10/16/2009. FINDINGS: Epiglottis is somewhat thickened and edematous in appearance, suspicious for possible acute epiglottitis. No retropharyngeal soft tissue  swelling or edema. Supraglottic and subglottic airway is are patent. No radiopaque foreign body. Multilevel degenerative spondylolysis within the visualized spine, most severe at C5-6. Visualized lung apices are clear. IMPRESSION: Thickening/ thumb printing of the epiglottis, suspicious for acute epiglottitis. Electronically Signed   By: Jeannine Boga M.D.   On: 11/06/2015 05:31    5:52 AM Rocephin and vancomycin ordered for findings suspicious for acute epiglottitis.      Shanon Rosser, MD 11/06/15 [phone]

## 2015-11-06 NOTE — Consult Note (Signed)
Reason for Consult:epiglottis Referring Physician: hospitalist  Dillon Arias is an 65 y.o. male.  HPI: hx of sore throat and a globus feeling in the throat on Friday. He seemed to feel better but then had increase feeling and was seen by Urgent care. He was given a medrol dose pack on Saturday. He starting having a feeling like his uvula was irritated and touching his tongue. He also felt like his airway was compromised with lying flat. He had no stridor or SOB. He came in bc of the persistence of these issues. He still is able to swallow and no significant sore throat at this time. He underwent a Ct scan which showed som,e thickened epiglottis and a mass in the hyoid area. No previous episode of this.  Past Medical History  Diagnosis Date  . Hypertension   . Hyperlipidemia   . GERD (gastroesophageal reflux disease)   . H/O renal calculi     Past Surgical History  Procedure Laterality Date  . Kidney stone surgery    . Pilonidal cyst excision    . Ptosis repair    . Extracorporeal shock wave lithotripsy    . Hernia repair      Left  . Colonoscopy with propofol N/A 01/22/2015    Procedure: COLONOSCOPY WITH PROPOFOL;  Surgeon: Garlan Fair, MD;  Location: [facility];  Service: Endoscopy;  Laterality: N/A;    Family History  Problem Relation Age of Onset  . Hypertension Mother   . Nephrolithiasis Mother   . Hypertension Father   . Diabetes Father   . Nephrolithiasis Father     Social History:  reports that he has never smoked. He has never used smokeless tobacco. He reports that he does not drink alcohol or use illicit drugs.  Allergies:  Allergies  Allergen Reactions  . Codeine Other (See Comments)    dysphoric   . Epinephrine Other (See Comments)    tachycardia    Medications: I have reviewed the patient's current medications.  Results for orders placed or performed during the hospital encounter of 11/06/15 (from the past 48 hour(s))  Basic metabolic panel      Status: Abnormal   Collection Time: 11/06/15  6:06 AM  Result Value Ref Range   Sodium 137 135 - 145 mmol/L   Potassium 3.7 3.5 - 5.1 mmol/L   Chloride 102 101 - 111 mmol/L   CO2 24 22 - 32 mmol/L   Glucose, Bld 132 (H) 65 - 99 mg/dL   BUN 18 6 - 20 mg/dL   Creatinine, Ser 0.76 0.61 - 1.24 mg/dL   Calcium 8.8 (L) 8.9 - 10.3 mg/dL   GFR calc non Af Amer >60 >60 mL/min   GFR calc Af Amer >60 >60 mL/min    Comment: (NOTE) The eGFR has been calculated using the CKD EPI equation. This calculation has not been validated in all clinical situations. eGFR's persistently <60 mL/min signify possible Chronic Kidney Disease.    Anion gap 11 5 - 15  CBC with Differential/Platelet     Status: Abnormal   Collection Time: 11/06/15  6:06 AM  Result Value Ref Range   WBC 19.2 (H) 4.0 - 10.5 K/uL   RBC 4.90 4.22 - 5.81 MIL/uL   Hemoglobin 15.4 13.0 - 17.0 g/dL   HCT 44.7 39.0 - 52.0 %   MCV 91.2 78.0 - 100.0 fL   MCH 31.4 26.0 - 34.0 pg   MCHC 34.5 30.0 - 36.0 g/dL   RDW 13.2 11.5 -  15.5 %   Platelets 244 150 - 400 K/uL   Neutrophils Relative % 83 %   Neutro Abs 15.8 (H) 1.7 - 7.7 K/uL   Lymphocytes Relative 8 %   Lymphs Abs 1.5 0.7 - 4.0 K/uL   Monocytes Relative 9 %   Monocytes Absolute 1.8 (H) 0.1 - 1.0 K/uL   Eosinophils Relative 0 %   Eosinophils Absolute 0.1 0.0 - 0.7 K/uL   Basophils Relative 0 %   Basophils Absolute 0.0 0.0 - 0.1 K/uL    Dg Neck Soft Tissue  11/06/2015  CLINICAL DATA:  Initial evaluation for acute sore throat. EXAM: NECK SOFT TISSUES - 1+ VIEW COMPARISON:  Prior study from 10/16/2009. FINDINGS: Epiglottis is somewhat thickened and edematous in appearance, suspicious for possible acute epiglottitis. No retropharyngeal soft tissue swelling or edema. Supraglottic and subglottic airway is are patent. No radiopaque foreign body. Multilevel degenerative spondylolysis within the visualized spine, most severe at C5-6. Visualized lung apices are clear. IMPRESSION: Thickening/  thumb printing of the epiglottis, suspicious for acute epiglottitis. Electronically Signed   By: Jeannine Boga M.D.   On: 11/06/2015 05:31   Ct Soft Tissue Neck W Contrast  11/06/2015  CLINICAL DATA:  65 year old hypertensive male presenting with sore throat, shortness breath, ear aching and cough since Friday. Treated with Dosepak. Swollen uvula. Subsequent encounter. EXAM: CT NECK WITH CONTRAST TECHNIQUE: Multidetector CT imaging of the neck was performed using the standard protocol following the bolus administration of intravenous contrast. CONTRAST:  87m ISOVUE-300 IOPAMIDOL (ISOVUE-300) INJECTION 61% COMPARISON:  None. FINDINGS: Pharynx and larynx: Swollen epiglottis. Anterior inferior to the swollen epiglottis is a 1.4 cm ill-defined collections suspicious for an abscess located superior posterior to the hyoid bone at the base of the tongue/epiglottis interface. Infected thyroglossal duct cyst is a consideration although this may reflect inflammatory process unrelated to thyroglossal duct cyst. This will need to be followed until clearance to exclude underlying malignancy. Mild inflammation extends to the glottic region with slight infiltration of surrounding fat planes. Tiny gas collection left lateral aspect and left posterior lateral aspect of the supraglottic region. Communication with air column not visualized. Small dissecting laryngoceles possible versus result of infection. Salivary glands: No primary salivary gland abnormality. Thyroid: No primary thyroid abnormality. Lymph nodes: Scattered lymph nodes some of which appear slightly rounded left level 2 and right level 3 possibly reactive in origin but will need to be followed if the patient does not respond to treatment to exclude possibly malignancy. Vascular: Scattered calcified plaque (right carotid bifurcation, distal left internal carotid artery proximal to the skullbase and cavernous segment internal carotid artery bilaterally). No  evidence of septic thrombophlebitis. Limited intracranial: Negative. Visualized orbits: Negative. Mastoids and visualized paranasal sinuses: Maxillary sinus mucosal thickening measuring up to 4 mm on right and 3 mm on the left. Skeleton: Cervical spondylotic changes with various degrees of spinal stenosis and foraminal narrowing most prominent C3-4 thru C6-7. Upper chest: No worrisome abnormality. IMPRESSION: Swollen epiglottis consistent with epiglottitis. Anterior inferior to the swollen epiglottis is a 1.4 cm ill-defined collections suspicious for an abscess located superior posterior to the hyoid bone at the base of the tongue/epiglottis interface. Infected thyroglossal duct cyst is a consideration although this may reflect inflammatory process unrelated to thyroglossal duct cyst. This will need to be followed until clearance to exclude underlying malignancy (does not appear to be case clinically). Mild inflammation extends to the glottic region with slight infiltration of surrounding fat planes. Tiny gas collection left lateral aspect  and left posterior lateral aspect of the supraglottic region. Communication with air column not visualized. Small dissecting laryngoceles possible versus result of infection. Slightly rounded level 2 and level 3 lymph nodes. These results were called by telephone at the time of interpretation on 11/06/2015 at 8:49 am to Dr. Marthenia Rolling, who verbally acknowledged these results. Electronically Signed   By: Genia Del M.D.   On: 11/06/2015 09:11    ROS Blood pressure 163/88, pulse 100, temperature 98.4 F (36.9 C), temperature source Oral, resp. rate 18, SpO2 95 %. Physical Exam  Constitutional: He appears well-developed and well-nourished.  HENT:  Head: Normocephalic and atraumatic.  Nose: Nose normal.  Mouth/Throat: Oropharynx is clear and moist.  The uvula is elongated but no erythema or exudate. There is mild exudate on the tonsils bilaterally. His voice is normal FOE-  the NP is clear. The epiglottis is perhaps slight thickened but overall it has a normal appearance. The vallecula is not full or swollen. BOT is without mass or swelling. Glottis is normal and cords move normally. Slight erythema of the arytenoids.  Neck no palpable mass but he is slightly tender over the hyoid bone.   Eyes: Conjunctivae are normal. Pupils are equal, round, and reactive to light.  Neck: Normal range of motion. Neck supple.    Assessment/Plan: Infected thyroglossal duct cyst- i would say this is most likely but need to see that it responds to medication. i dont think I/D is necessary at this time. He should be on Zosyn and vancomycin with Vancomycin being optional. He can eat today. If he improves of his complaints of feeling like he is compromised lying down and soreness gone he could be discharged on clindamycin. I do not see why he would actually have a compromise of his airway when lying down and think this is just a sensation of the uvula falling on the posterior wall. His epiglottis is not the issue.   Melissa Montane 11/06/2015, 12:26 PM

## 2015-11-06 NOTE — Progress Notes (Signed)
Pharmacy Antibiotic Note  Dillon Arias is a 65 y.o. male admitted on 11/06/2015 with epiglotitis.  Pharmacy has been consulted for vancomycin dosing.  Plan: Vancomycin 1250mg  IV q12h Rocephin 2gm x1 ED f/u admission orders     Temp (24hrs), Avg:98.4 F (36.9 C), Min:98.4 F (36.9 C), Max:98.4 F (36.9 C)  No results for input(s): WBC, CREATININE, LATICACIDVEN, VANCOTROUGH, VANCOPEAK, VANCORANDOM, GENTTROUGH, GENTPEAK, GENTRANDOM, TOBRATROUGH, TOBRAPEAK, TOBRARND, AMIKACINPEAK, AMIKACINTROU, AMIKACIN in the last 168 hours.  CrCl cannot be calculated (Patient has no serum creatinine result on file.).    Allergies  Allergen Reactions  . Codeine Other (See Comments)    dysphoric     Antimicrobials this admission: 6/6 rocephin >>  6/6 vancomycin >>   Dose adjustments this admission:   Microbiology results:  BCx:   UCx:    Sputum:    MRSA PCR:   Thank you for allowing pharmacy to be a part of this patient's care.  Dorrene German 11/06/2015 6:08 AM

## 2015-11-06 NOTE — ED Notes (Signed)
Patient transported to CT 

## 2015-11-06 NOTE — ED Notes (Signed)
Family at bedside. 

## 2015-11-07 DIAGNOSIS — K148 Other diseases of tongue: Secondary | ICD-10-CM | POA: Diagnosis present

## 2015-11-07 DIAGNOSIS — E0789 Other specified disorders of thyroid: Secondary | ICD-10-CM

## 2015-11-07 LAB — CBC WITH DIFFERENTIAL/PLATELET
Basophils Absolute: 0 10*3/uL (ref 0.0–0.1)
Basophils Relative: 0 %
Eosinophils Absolute: 0 10*3/uL (ref 0.0–0.7)
Eosinophils Relative: 0 %
HCT: 45.5 % (ref 39.0–52.0)
Hemoglobin: 15.4 g/dL (ref 13.0–17.0)
Lymphocytes Relative: 8 %
Lymphs Abs: 1.6 10*3/uL (ref 0.7–4.0)
MCH: 31 pg (ref 26.0–34.0)
MCHC: 33.8 g/dL (ref 30.0–36.0)
MCV: 91.7 fL (ref 78.0–100.0)
Monocytes Absolute: 0.9 10*3/uL (ref 0.1–1.0)
Monocytes Relative: 5 %
Neutro Abs: 16.9 10*3/uL — ABNORMAL HIGH (ref 1.7–7.7)
Neutrophils Relative %: 87 %
Platelets: 301 10*3/uL (ref 150–400)
RBC: 4.96 MIL/uL (ref 4.22–5.81)
RDW: 13.1 % (ref 11.5–15.5)
WBC: 19.4 10*3/uL — ABNORMAL HIGH (ref 4.0–10.5)

## 2015-11-07 LAB — BASIC METABOLIC PANEL
Anion gap: 8 (ref 5–15)
BUN: 19 mg/dL (ref 6–20)
CO2: 26 mmol/L (ref 22–32)
Calcium: 8.9 mg/dL (ref 8.9–10.3)
Chloride: 105 mmol/L (ref 101–111)
Creatinine, Ser: 0.65 mg/dL (ref 0.61–1.24)
GFR calc Af Amer: >60 mL/min (ref >60)
GFR calc non Af Amer: >60 mL/min (ref >60)
Glucose, Bld: 156 mg/dL — ABNORMAL HIGH (ref 65–99)
Potassium: 3.9 mmol/L (ref 3.5–5.1)
Sodium: 139 mmol/L (ref 135–145)

## 2015-11-07 MED ORDER — CLINDAMYCIN HCL 300 MG PO CAPS: 300.0000 mg | ORAL_CAPSULE | Freq: Three times a day (TID) | ORAL | Status: DC

## 2015-11-07 MED ORDER — CLINDAMYCIN HCL 300 MG PO CAPS
300.0000 mg | ORAL_CAPSULE | Freq: Three times a day (TID) | ORAL | Status: DC
  Filled 2015-11-07 (×4): qty 1

## 2015-11-07 MED FILL — CLINDAMYCIN HCL 300 MG CAPS: 300 | 10 days supply | Qty: 30 | Fill #0

## 2015-11-07 NOTE — Discharge Summary (Signed)
Physician Discharge Summary  POLLUX EYE [id number] DOB: [birth year] DOA: 11/06/2015  PCP: Henrine Screws, MD  Admit date: 11/06/2015 Discharge date: 11/07/2015  Time spent: 35 minutes  Recommendations for Outpatient Follow-up:  1. Mr. Cancilla was admitted for infected thyroglossal duct cyst, seen by Dr Janace Hoard of ENT during this hospitalization, he was discharged on clindamycin and given follow-up appointment with Dr Janace Hoard in the office.    Discharge Diagnoses:  Active Problems:   Thyroglossal duct infection   Discharge Condition: Stable, on day of discharge did not have evidence of airway compromise, tolerating by mouth intake.  Diet recommendation: Heart healthy diet  Filed Weights   11/06/15 1040  Weight: 99.7 kg (219 lb 12.8 oz)    History of present illness:  65 year old male with history of Hypertension and hyperlipidemia. Patient was seen alongside patient's wife. Patient tells me that his problems started about 4 weeks ago with bilateral ear ache, worse on the right side with associated tinnitus. Patient was seen by the PCP and prescribed allegra D, Flonase and Steroids after a negative examination. Patient relatively well until about 3 days ago when he developed what seems like post nasal drip syndrome. Problem became worse 2 days ago as he found difficult lying down at night time, with associated coughing, hoarseness of the speech and was noted to have enlarged uvular and whitish mucus around the upper respiratory area at an Urgent center according to patient. No associated fever or chills. Patient was started on prednisone at the Urgent Care center. Patient presents today with worsening symptoms and CT of the neck revealed epiglotittis with possible abscess as per the Radiologist (I have not visualized official report). Patient will be continued on IV Vancomycin and Rocephin. ENT has been consulted.  Hospital Course:  65 year old with past medical history of  hypertension and dyslipidemia, admitted to medicine service on 11/06/2015 when he presented with complaints of throat discomfort. Prior to this hospitalization he had been prescribed Medrol Dosepak. Despite this intervention he felt symptoms were worsening. On presentation he did not have evidence of airway obstruction, and had been breathing comfortably on room air. Initial workup included a CT scan of the neck which per radiology revealed thicken epiglottis. There was concern that this could reflect acute epiglottitis. Dr Janace Hoard of ENT was consulted to evaluate region with endoscopy. Uvula appeared elongated without erythema or exudate. Epiglottis appear to have "overall normal appearance." Symptoms were felt to be secondary to infected thyroglossal duct cyst. ENT did not recommend incision and drainage. Patient was monitored closely overnight, by the following morning he reported feeling much better. He reported significant improvement to throat discomfort, was able swallow, did not have stridor or shortness of breath. He was discharged on oral clindamycin given instructions to follow-up with ENT in the office in one week.   Consultations:  ENT  Discharge Exam: Filed Vitals:   11/07/15 0554 11/07/15 0700  BP: 143/63 156/78  Pulse: 81 77  Temp:    Resp:      General: Nontoxic appearing, awake and alert no acute distress HEENT: Uvula appearing elongated however I did not note evidence of exudate or erythema Cardiovascular: Regular rate rhythm normal S1-S2 no murmurs rubs or gallops Respiratory: Normal respiratory effort lungs are clear Abdomen: Soft nontender nondistended   Discharge Instructions   Discharge Instructions    Call MD for:  difficulty breathing, headache or visual disturbances    Complete by:  As directed      Call MD  for:  extreme fatigue    Complete by:  As directed      Call MD for:  hives    Complete by:  As directed      Call MD for:  persistant dizziness or  light-headedness    Complete by:  As directed      Call MD for:  persistant nausea and vomiting    Complete by:  As directed      Call MD for:  redness, tenderness, or signs of infection (pain, swelling, redness, odor or green/yellow discharge around incision site)    Complete by:  As directed      Call MD for:  severe uncontrolled pain    Complete by:  As directed      Call MD for:  temperature >100.4    Complete by:  As directed      Call MD for:    Complete by:  As directed      Diet - low sodium heart healthy    Complete by:  As directed      Increase activity slowly    Complete by:  As directed           Current Discharge Medication List    START taking these medications   Details  clindamycin (CLEOCIN) 300 MG capsule Take 1 capsule (300 mg total) by mouth every 8 (eight) hours. Qty: 30 capsule, Refills: 0      CONTINUE these medications which have NOT CHANGED   Details  aspirin 81 MG tablet Take 81 mg by mouth every other day.     atorvastatin (LIPITOR) 10 MG tablet Take 10 mg by mouth daily.    cholecalciferol (VITAMIN D) 1000 UNITS tablet Take 1,000 Units by mouth daily.    fluticasone (FLONASE) 50 MCG/ACT nasal spray Place 2 sprays into both nostrils daily. Qty: 9.9 g, Refills: 2    hydrochlorothiazide (MICROZIDE) 12.5 MG capsule Take 1 capsule by mouth daily. Refills: 3    Multiple Vitamin (MULTIVITAMIN WITH MINERALS) TABS tablet Take 1 tablet by mouth daily.    pantoprazole (PROTONIX) 20 MG tablet Take 20 mg by mouth daily.    sildenafil (REVATIO) 20 MG tablet Take 1 tablet by mouth as needed. Erectile dysfunction Refills: 0    valsartan (DIOVAN) 160 MG tablet Take 1 tablet by mouth daily. Refills: 3      STOP taking these medications     predniSONE (DELTASONE) 20 MG tablet        Allergies  Allergen Reactions  . Codeine Other (See Comments)    dysphoric   . Epinephrine Other (See Comments)    tachycardia   Follow-up Information    Follow  up with Melissa Montane, MD In 1 week.   Specialty:  Otolaryngology   Contact information:   [address] [place] [address] [phone]       Follow up with GATES,ROBERT NEVILL, MD In 2 weeks.   Specialty:  Internal Medicine   Contact information:   [address] [place] [address] [phone]        The results of significant diagnostics from this hospitalization (including imaging, microbiology, ancillary and laboratory) are listed below for reference.    Significant Diagnostic Studies: Dg Neck Soft Tissue  11/06/2015  CLINICAL DATA:  Initial evaluation for acute sore throat. EXAM: NECK SOFT TISSUES - 1+ VIEW COMPARISON:  Prior study from 10/16/2009. FINDINGS: Epiglottis is somewhat thickened and edematous in appearance, suspicious for  possible acute epiglottitis. No retropharyngeal soft tissue swelling or edema. Supraglottic and subglottic airway is are patent. No radiopaque foreign body. Multilevel degenerative spondylolysis within the visualized spine, most severe at C5-6. Visualized lung apices are clear. IMPRESSION: Thickening/ thumb printing of the epiglottis, suspicious for acute epiglottitis. Electronically Signed   By: Jeannine Boga M.D.   On: 11/06/2015 05:31   Ct Soft Tissue Neck W Contrast  11/06/2015  CLINICAL DATA:  65 year old hypertensive male presenting with sore throat, shortness breath, ear aching and cough since Friday. Treated with Dosepak. Swollen uvula. Subsequent encounter. EXAM: CT NECK WITH CONTRAST TECHNIQUE: Multidetector CT imaging of the neck was performed using the standard protocol following the bolus administration of intravenous contrast. CONTRAST:  28mL ISOVUE-300 IOPAMIDOL (ISOVUE-300) INJECTION 61% COMPARISON:  None. FINDINGS: Pharynx and larynx: Swollen epiglottis. Anterior inferior to the swollen epiglottis is a 1.4 cm ill-defined collections suspicious for an abscess located superior posterior to the hyoid bone  at the base of the tongue/epiglottis interface. Infected thyroglossal duct cyst is a consideration although this may reflect inflammatory process unrelated to thyroglossal duct cyst. This will need to be followed until clearance to exclude underlying malignancy. Mild inflammation extends to the glottic region with slight infiltration of surrounding fat planes. Tiny gas collection left lateral aspect and left posterior lateral aspect of the supraglottic region. Communication with air column not visualized. Small dissecting laryngoceles possible versus result of infection. Salivary glands: No primary salivary gland abnormality. Thyroid: No primary thyroid abnormality. Lymph nodes: Scattered lymph nodes some of which appear slightly rounded left level 2 and right level 3 possibly reactive in origin but will need to be followed if the patient does not respond to treatment to exclude possibly malignancy. Vascular: Scattered calcified plaque (right carotid bifurcation, distal left internal carotid artery proximal to the skullbase and cavernous segment internal carotid artery bilaterally). No evidence of septic thrombophlebitis. Limited intracranial: Negative. Visualized orbits: Negative. Mastoids and visualized paranasal sinuses: Maxillary sinus mucosal thickening measuring up to 4 mm on right and 3 mm on the left. Skeleton: Cervical spondylotic changes with various degrees of spinal stenosis and foraminal narrowing most prominent C3-4 thru C6-7. Upper chest: No worrisome abnormality. IMPRESSION: Swollen epiglottis consistent with epiglottitis. Anterior inferior to the swollen epiglottis is a 1.4 cm ill-defined collections suspicious for an abscess located superior posterior to the hyoid bone at the base of the tongue/epiglottis interface. Infected thyroglossal duct cyst is a consideration although this may reflect inflammatory process unrelated to thyroglossal duct cyst. This will need to be followed until clearance to  exclude underlying malignancy (does not appear to be case clinically). Mild inflammation extends to the glottic region with slight infiltration of surrounding fat planes. Tiny gas collection left lateral aspect and left posterior lateral aspect of the supraglottic region. Communication with air column not visualized. Small dissecting laryngoceles possible versus result of infection. Slightly rounded level 2 and level 3 lymph nodes. These results were called by telephone at the time of interpretation on 11/06/2015 at 8:49 am to Dr. Marthenia Rolling, who verbally acknowledged these results. Electronically Signed   By: Genia Del M.D.   On: 11/06/2015 09:11    Microbiology: Recent Results (from the past 240 hour(s))  MRSA PCR Screening     Status: None   Collection Time: 11/06/15 10:49 AM  Result Value Ref Range Status   MRSA by PCR NEGATIVE NEGATIVE Final    Comment:        The GeneXpert MRSA Assay (FDA approved for NASAL  specimens only), is one component of a comprehensive MRSA colonization surveillance program. It is not intended to diagnose MRSA infection nor to guide or monitor treatment for MRSA infections.      Labs: Basic Metabolic Panel:  Recent Labs Lab 11/06/15 0606 11/07/15 0308  NA 137 139  K 3.7 3.9  CL 102 105  CO2 24 26  GLUCOSE 132* 156*  BUN 18 19  CREATININE 0.76 0.65  CALCIUM 8.8* 8.9   Liver Function Tests: No results for input(s): AST, ALT, ALKPHOS, BILITOT, PROT, ALBUMIN in the last 168 hours. No results for input(s): LIPASE, AMYLASE in the last 168 hours. No results for input(s): AMMONIA in the last 168 hours. CBC:  Recent Labs Lab 11/06/15 0606 11/07/15 0308  WBC 19.2* 19.4*  NEUTROABS 15.8* 16.9*  HGB 15.4 15.4  HCT 44.7 45.5  MCV 91.2 91.7  PLT 244 301   Cardiac Enzymes: No results for input(s): CKTOTAL, CKMB, CKMBINDEX, TROPONINI in the last 168 hours. BNP: BNP (last 3 results) No results for input(s): BNP in the last 8760 hours.  ProBNP  (last 3 results) No results for input(s): PROBNP in the last 8760 hours.  CBG: No results for input(s): GLUCAP in the last 168 hours.     Signed:  Kelvin Cellar MD.  [facility] 11/07/2015, 7:45 AM

## 2015-11-13 DIAGNOSIS — Q892 Congenital malformations of other endocrine glands: Secondary | ICD-10-CM | POA: Insufficient documentation

## 2015-11-21 DIAGNOSIS — Q892 Congenital malformations of other endocrine glands: Secondary | ICD-10-CM | POA: Diagnosis not present

## 2015-11-26 DIAGNOSIS — H11153 Pinguecula, bilateral: Secondary | ICD-10-CM | POA: Diagnosis not present

## 2015-11-26 DIAGNOSIS — J392 Other diseases of pharynx: Secondary | ICD-10-CM | POA: Diagnosis not present

## 2015-11-26 DIAGNOSIS — H43393 Other vitreous opacities, bilateral: Secondary | ICD-10-CM | POA: Diagnosis not present

## 2015-12-07 ENCOUNTER — Other Ambulatory Visit: Payer: Self-pay | Admitting: Otolaryngology

## 2015-12-07 DIAGNOSIS — Q892 Congenital malformations of other endocrine glands: Secondary | ICD-10-CM

## 2015-12-10 MED FILL — PANTOPRAZOLE SOD DR 40 MG T: 40 | 90 days supply | Qty: 90 | Fill #1

## 2015-12-14 ENCOUNTER — Other Ambulatory Visit: Payer: Medicare Other

## 2016-01-01 ENCOUNTER — Ambulatory Visit
Admission: RE | Admit: 2016-01-01 | Discharge: 2016-01-01 | Disposition: A | Discharge status: Home / Self Care | Payer: Medicare Other | Source: Ambulatory Visit | Attending: Otolaryngology | Admitting: Otolaryngology

## 2016-01-01 DIAGNOSIS — Q892 Congenital malformations of other endocrine glands: Secondary | ICD-10-CM

## 2016-01-01 DIAGNOSIS — R221 Localized swelling, mass and lump, neck: Secondary | ICD-10-CM | POA: Diagnosis not present

## 2016-01-01 MED ORDER — IOPAMIDOL (ISOVUE-300) INJECTION 61%
75.0000 mL | Freq: Once | INTRAVENOUS | Status: AC | PRN
  Administered 2016-01-01: 75 mL via INTRAVENOUS

## 2016-01-03 DIAGNOSIS — K219 Gastro-esophageal reflux disease without esophagitis: Secondary | ICD-10-CM | POA: Insufficient documentation

## 2016-01-03 DIAGNOSIS — Q892 Congenital malformations of other endocrine glands: Secondary | ICD-10-CM | POA: Diagnosis not present

## 2016-01-07 MED FILL — ATORVASTATIN 20 MG TABLET: 20 | 90 days supply | Qty: 90 | Fill #0

## 2016-01-07 MED FILL — HYDROCHLOROTHIAZIDE 12.5 MG: 12.5 | 90 days supply | Qty: 90 | Fill #0

## 2016-01-07 MED FILL — VALSARTAN 160 MG TABLET: 160 | 90 days supply | Qty: 90 | Fill #0

## 2016-01-18 MED FILL — SILDENAFIL 20 MG TABLET: 20 | 10 days supply | Qty: 30 | Fill #0

## 2016-03-31 MED FILL — PANTOPRAZOLE SOD DR 40 MG T: 40 | 90 days supply | Qty: 90 | Fill #2

## 2016-03-31 MED FILL — ATORVASTATIN 20 MG TABLET: 20 | 90 days supply | Qty: 90 | Fill #1

## 2016-04-07 DIAGNOSIS — M65341 Trigger finger, right ring finger: Secondary | ICD-10-CM | POA: Diagnosis not present

## 2016-04-14 MED FILL — HYDROCHLOROTHIAZIDE 12.5 MG: 12.5 | 90 days supply | Qty: 90 | Fill #1

## 2016-04-14 MED FILL — VALSARTAN 160 MG TABLET: 160 | 90 days supply | Qty: 90 | Fill #1

## 2016-05-05 DIAGNOSIS — Z23 Encounter for immunization: Secondary | ICD-10-CM | POA: Diagnosis not present

## 2016-05-05 DIAGNOSIS — B351 Tinea unguium: Secondary | ICD-10-CM | POA: Diagnosis not present

## 2016-05-05 DIAGNOSIS — Z0001 Encounter for general adult medical examination with abnormal findings: Secondary | ICD-10-CM | POA: Diagnosis not present

## 2016-05-05 DIAGNOSIS — I10 Essential (primary) hypertension: Secondary | ICD-10-CM | POA: Diagnosis not present

## 2016-05-05 DIAGNOSIS — N5201 Erectile dysfunction due to arterial insufficiency: Secondary | ICD-10-CM | POA: Diagnosis not present

## 2016-05-05 DIAGNOSIS — Q892 Congenital malformations of other endocrine glands: Secondary | ICD-10-CM | POA: Diagnosis not present

## 2016-05-05 DIAGNOSIS — Z79899 Other long term (current) drug therapy: Secondary | ICD-10-CM | POA: Diagnosis not present

## 2016-05-05 DIAGNOSIS — E6609 Other obesity due to excess calories: Secondary | ICD-10-CM | POA: Diagnosis not present

## 2016-05-05 DIAGNOSIS — E782 Mixed hyperlipidemia: Secondary | ICD-10-CM | POA: Diagnosis not present

## 2016-05-05 DIAGNOSIS — E559 Vitamin D deficiency, unspecified: Secondary | ICD-10-CM | POA: Diagnosis not present

## 2016-05-05 DIAGNOSIS — K219 Gastro-esophageal reflux disease without esophagitis: Secondary | ICD-10-CM | POA: Diagnosis not present

## 2016-05-19 MED FILL — NAPROXEN 500 MG TABLET: 500 | 4 days supply | Qty: 12 | Fill #0

## 2016-06-04 MED FILL — FAMOTIDINE 20 MG TABLET: 20 | 30 days supply | Qty: 30 | Fill #0

## 2016-06-24 MED FILL — SILDENAFIL 20 MG TABLET: 20 | 10 days supply | Qty: 30 | Fill #0

## 2016-07-08 MED FILL — VALSARTAN 160 MG TABLET: 160 | 90 days supply | Qty: 90 | Fill #2

## 2016-07-08 MED FILL — ATORVASTATIN 20 MG TABLET: 20 | 90 days supply | Qty: 90 | Fill #2

## 2016-07-08 MED FILL — HYDROCHLOROTHIAZIDE 12.5 MG: 12.5 | 90 days supply | Qty: 90 | Fill #2

## 2016-07-16 DIAGNOSIS — M79641 Pain in right hand: Secondary | ICD-10-CM | POA: Diagnosis not present

## 2016-07-16 DIAGNOSIS — M65331 Trigger finger, right middle finger: Secondary | ICD-10-CM | POA: Diagnosis not present

## 2016-08-21 DIAGNOSIS — J029 Acute pharyngitis, unspecified: Secondary | ICD-10-CM | POA: Diagnosis not present

## 2016-08-21 DIAGNOSIS — J309 Allergic rhinitis, unspecified: Secondary | ICD-10-CM | POA: Diagnosis not present

## 2016-09-29 MED FILL — FAMOTIDINE 20 MG TABLET: 20 | 30 days supply | Qty: 30 | Fill #1

## 2016-09-30 MED FILL — PANTOPRAZOLE SOD DR 40 MG T: 40 | 84 days supply | Qty: 60 | Fill #0

## 2016-10-15 MED FILL — VALSARTAN-HCTZ 160-12.5 MG: 160-12.5 | 90 days supply | Qty: 90 | Fill #0

## 2016-10-15 MED FILL — ATORVASTATIN 20 MG TABLET: 20 | 90 days supply | Qty: 90 | Fill #3

## 2016-10-31 DIAGNOSIS — R05 Cough: Secondary | ICD-10-CM | POA: Diagnosis not present

## 2016-10-31 DIAGNOSIS — R062 Wheezing: Secondary | ICD-10-CM | POA: Diagnosis not present

## 2016-10-31 MED FILL — predniSONE 10 MG TABS: 10 | 6 days supply | Qty: 21 | Fill #0

## 2016-10-31 MED FILL — VENTOLIN HFA 90 MCG INHALER: 108 (90 BAS | 17 days supply | Qty: 18 | Fill #0

## 2016-11-03 MED FILL — FAMOTIDINE 20 MG TABLET: 20 | 30 days supply | Qty: 30 | Fill #2

## 2016-11-05 MED FILL — AZITHROMYCIN 250 MG TABLET: 250 | 5 days supply | Qty: 6 | Fill #0

## 2016-12-05 MED FILL — FAMOTIDINE 20 MG TABLET: 20 | 30 days supply | Qty: 30 | Fill #3

## 2016-12-16 MED FILL — SILDENAFIL 20 MG TABLET: 20 | 10 days supply | Qty: 30 | Fill #1

## 2016-12-22 MED FILL — CEPHALEXIN 500 MG CAPSULE: 500 | 5 days supply | Qty: 20 | Fill #0

## 2017-01-05 DIAGNOSIS — M65331 Trigger finger, right middle finger: Secondary | ICD-10-CM | POA: Diagnosis not present

## 2017-01-12 DIAGNOSIS — M65331 Trigger finger, right middle finger: Secondary | ICD-10-CM | POA: Diagnosis not present

## 2017-01-12 MED FILL — VALSARTAN-HCTZ 160-12.5 MG: 160-12.5 | 90 days supply | Qty: 90 | Fill #1

## 2017-01-12 MED FILL — ATORVASTATIN 20 MG TABLET: 20 | 90 days supply | Qty: 90 | Fill #0

## 2017-01-13 MED FILL — FAMOTIDINE 20 MG TABLET: 20 | 30 days supply | Qty: 30 | Fill #4

## 2017-01-19 DIAGNOSIS — M65331 Trigger finger, right middle finger: Secondary | ICD-10-CM | POA: Diagnosis not present

## 2017-01-21 MED FILL — PANTOPRAZOLE SOD DR 40 MG T: 40 | 84 days supply | Qty: 60 | Fill #1

## 2017-02-05 DIAGNOSIS — M65331 Trigger finger, right middle finger: Secondary | ICD-10-CM | POA: Diagnosis not present

## 2017-02-09 MED FILL — FAMOTIDINE 20 MG TABLET: 20 | 90 days supply | Qty: 90 | Fill #0

## 2017-04-14 MED FILL — ATORVASTATIN 20 MG TABLET: 20 | 90 days supply | Qty: 90 | Fill #1

## 2017-04-14 MED FILL — VALSARTAN-HCTZ 160-12.5 MG: 160-12.5 | 90 days supply | Qty: 90 | Fill #2

## 2017-05-07 MED FILL — FAMOTIDINE 20 MG TABLET: 20 | 90 days supply | Qty: 90 | Fill #1

## 2017-05-14 MED FILL — SILDENAFIL 20 MG TABLET: 20 | 10 days supply | Qty: 30 | Fill #0

## 2017-05-15 DIAGNOSIS — Z1389 Encounter for screening for other disorder: Secondary | ICD-10-CM | POA: Diagnosis not present

## 2017-05-15 DIAGNOSIS — Z0001 Encounter for general adult medical examination with abnormal findings: Secondary | ICD-10-CM | POA: Diagnosis not present

## 2017-05-15 DIAGNOSIS — I10 Essential (primary) hypertension: Secondary | ICD-10-CM | POA: Diagnosis not present

## 2017-05-15 DIAGNOSIS — E782 Mixed hyperlipidemia: Secondary | ICD-10-CM | POA: Diagnosis not present

## 2017-06-03 MED FILL — PANTOPRAZOLE SOD DR 40 MG T: 40 | 84 days supply | Qty: 60 | Fill #2

## 2017-07-15 MED FILL — VALSARTAN-HCTZ 160-12.5 MG: 160-12.5 | 90 days supply | Qty: 90 | Fill #3

## 2017-07-15 MED FILL — ATORVASTATIN 20 MG TABLET: 20 | 90 days supply | Qty: 90 | Fill #2

## 2017-08-10 MED FILL — FAMOTIDINE 20 MG TABLET: 20 | 90 days supply | Qty: 90 | Fill #2

## 2017-08-25 DIAGNOSIS — R05 Cough: Secondary | ICD-10-CM | POA: Diagnosis not present

## 2017-08-25 DIAGNOSIS — J029 Acute pharyngitis, unspecified: Secondary | ICD-10-CM | POA: Diagnosis not present

## 2017-08-25 MED FILL — AZITHROMYCIN 250 MG TABLET: 250 | 5 days supply | Qty: 6 | Fill #0

## 2017-10-08 MED FILL — VALSARTAN-HCTZ 160-12.5 MG: 160-12.5 | 90 days supply | Qty: 90 | Fill #0

## 2017-10-08 MED FILL — ATORVASTATIN 20 MG TABLET: 20 | 90 days supply | Qty: 90 | Fill #0

## 2017-10-08 MED FILL — PANTOPRAZOLE SOD DR 40 MG T: 40 | 84 days supply | Qty: 60 | Fill #0

## 2017-10-12 MED FILL — SILDENAFIL 20 MG TABLET: 20 | 10 days supply | Qty: 30 | Fill #1

## 2017-11-13 MED FILL — FAMOTIDINE 20 MG TABLET: 20 | 90 days supply | Qty: 90 | Fill #3

## 2017-12-04 ENCOUNTER — Encounter: Payer: Self-pay | Admitting: *Deleted

## 2017-12-04 ENCOUNTER — Emergency Department (INDEPENDENT_AMBULATORY_CARE_PROVIDER_SITE_OTHER)
Admission: EM | Admit: 2017-12-04 | Discharge: 2017-12-04 | Disposition: A | Discharge status: Home / Self Care | Payer: Medicare Other | Source: Home / Self Care | Attending: Family Medicine | Admitting: Family Medicine

## 2017-12-04 DIAGNOSIS — M545 Low back pain, unspecified: Secondary | ICD-10-CM

## 2017-12-04 DIAGNOSIS — Z87442 Personal history of urinary calculi: Secondary | ICD-10-CM

## 2017-12-04 LAB — POCT URINALYSIS DIP (MANUAL ENTRY)
Bilirubin, UA: NEGATIVE
Blood, UA: NEGATIVE
Glucose, UA: NEGATIVE mg/dL
Leukocytes, UA: NEGATIVE
Nitrite, UA: NEGATIVE
Protein Ur, POC: NEGATIVE mg/dL
Spec Grav, UA: 1.025 (ref 1.010–1.025)
Urobilinogen, UA: 0.2 E.U./dL
pH, UA: 5.5 (ref 5.0–8.0)

## 2017-12-04 MED ORDER — METHOCARBAMOL 500 MG PO TABS: 500.0000 mg | ORAL_TABLET | Freq: Two times a day (BID) | ORAL | 0 refills | Status: DC

## 2017-12-04 NOTE — Discharge Instructions (Signed)
°  You may take 500mg  acetaminophen every 4-6 hours or in combination with ibuprofen 400-600mg  every 6-8 hours as needed for pain and inflammation.  Robaxin (methocarbamol) is a muscle relaxer and may cause drowsiness. Do not drink alcohol, drive, or operate heavy machinery while taking.  Please follow up with family medicine in 1 week if not improving, sooner if worsening or new symptoms develop.

## 2017-12-04 NOTE — ED Triage Notes (Signed)
Patient c/o 2 weeks of what started as flank pain but is now lower bilaterally. He has been lifting heavy objects recently but also has a h/o kidney stones. Denies urinary symptoms, fever or nausea. Taken Advil and tylenol.

## 2017-12-04 NOTE — ED Provider Notes (Signed)
[facility]    CSN: [id number] Arrival date & time: 12/04/17  1217     History   Chief Complaint Chief Complaint  Patient presents with  . Back Pain    HPI Dillon Arias is a 67 y.o. male.   HPI  Dillon Arias is a 67 y.o. male presenting to UC with c/o 2 weeks of Left sided flank pain that has now developed into bilateral lower back pain. Pain is aching and sore, worse in the morning and sometimes worse with certain movements. He is active and wonders if he has a muscle strain but he is concerned it has not improved after Advil and Tylenol. Hx of renal stone on Left side about 12 years ago, pt wants to ensure his pain is not due to a stone. Denies fever, chills, n/v/d, abdominal pain, hematuria, dysuria, or decreased urine.   Past Medical History:  Diagnosis Date  . GERD (gastroesophageal reflux disease)   . H/O renal calculi   . Hyperlipidemia   . Hypertension     Patient Active Problem List   Diagnosis Date Noted  . Thyroglossal duct infection 11/07/2015  . Acute epiglottiditis 11/06/2015  . Epiglottiditis 11/06/2015    Past Surgical History:  Procedure Laterality Date  . COLONOSCOPY WITH PROPOFOL N/A 01/22/2015   Procedure: COLONOSCOPY WITH PROPOFOL;  Surgeon: Garlan Fair, MD;  Location: [facility];  Service: Endoscopy;  Laterality: N/A;  . EXTRACORPOREAL SHOCK WAVE LITHOTRIPSY    . HERNIA REPAIR     Left  . KIDNEY STONE SURGERY    . PILONIDAL CYST EXCISION    . PTOSIS REPAIR         Home Medications    Prior to Admission medications   Medication Sig Start Date End Date Taking? Authorizing Provider  aspirin 81 MG tablet Take 81 mg by mouth every other day.     [provider]  atorvastatin (LIPITOR) 10 MG tablet Take 10 mg by mouth daily.    [provider]  cholecalciferol (VITAMIN D) 1000 UNITS tablet Take 1,000 Units by mouth daily.    [provider]  fluticasone (FLONASE) 50 MCG/ACT nasal spray  Place 2 sprays into both nostrils daily. 11/03/15   Noe Gens, PA-C  hydrochlorothiazide (MICROZIDE) 12.5 MG capsule Take 1 capsule by mouth daily. 10/08/15   [provider]  methocarbamol (ROBAXIN) 500 MG tablet Take 1 tablet (500 mg total) by mouth 2 (two) times daily. 12/04/17   Noe Gens, PA-C  Multiple Vitamin (MULTIVITAMIN WITH MINERALS) TABS tablet Take 1 tablet by mouth daily.    [provider]  pantoprazole (PROTONIX) 20 MG tablet Take 20 mg by mouth daily.    [provider]  sildenafil (REVATIO) 20 MG tablet Take 1 tablet by mouth as needed. Erectile dysfunction 08/23/15   [provider]  valsartan (DIOVAN) 160 MG tablet Take 1 tablet by mouth daily. 10/08/15   [provider]    Family History Family History  Problem Relation Age of Onset  . Hypertension Mother   . Nephrolithiasis Mother   . Hypertension Father   . Diabetes Father   . Nephrolithiasis Father     Social History Social History   Tobacco Use  . Smoking status: Never Smoker  . Smokeless tobacco: Never Used  Substance Use Topics  . Alcohol use: No  . Drug use: No     Allergies   Codeine and Epinephrine   Review of Systems Review of  Systems  Gastrointestinal: Negative for abdominal pain, diarrhea, nausea and vomiting.  Genitourinary: Positive for flank pain (Left). Negative for decreased urine volume, dysuria, frequency, hematuria and urgency.  Musculoskeletal: Positive for back pain and myalgias. Negative for neck pain and neck stiffness.  Skin: Negative for rash.  Neurological: Negative for weakness and numbness.     Physical Exam Triage Vital Signs ED Triage Vitals  Enc Vitals Group     BP 12/04/17 1254 (!) 164/80     Pulse Rate 12/04/17 1254 76     Resp --      Temp 12/04/17 1254 98.3 F (36.8 C)     Temp Source 12/04/17 1254 Oral     SpO2 12/04/17 1254 97 %     Weight 12/04/17 1255 232 lb (105.2 kg)     Height --      Head  Circumference --      Peak Flow --      Pain Score 12/04/17 1255 0     Pain Loc --      Pain Edu? --      Excl. in [place]? --    No data found.  Updated Vital Signs BP (!) 164/80 (BP Location: Right Arm)   Pulse 76   Temp 98.3 F (36.8 C) (Oral)   Wt 232 lb (105.2 kg)   SpO2 97%   BMI 36.34 kg/m   Visual Acuity Right Eye Distance:   Left Eye Distance:   Bilateral Distance:    Right Eye Near:   Left Eye Near:    Bilateral Near:     Physical Exam  Constitutional: He is oriented to person, place, and time. He appears well-developed and well-nourished. No distress.  HENT:  Head: Normocephalic and atraumatic.  Eyes: EOM are normal.  Neck: Normal range of motion.  Cardiovascular: Normal rate.  Pulmonary/Chest: Effort normal. No respiratory distress. He exhibits no tenderness.  Abdominal: Soft. He exhibits no distension. There is no tenderness. There is no CVA tenderness.  Musculoskeletal: Normal range of motion. He exhibits tenderness. He exhibits no edema.  No midline spinal tenderness. Tenderness to lower lumbar muscles bilaterally. Full ROM upper and lower extremities. Negative straight leg raise.   Neurological: He is alert and oriented to person, place, and time.  Skin: Skin is warm and dry. He is not diaphoretic.  Psychiatric: He has a normal mood and affect. His behavior is normal.  Nursing note and vitals reviewed.    UC Treatments / Results  Labs (all labs ordered are listed, but only abnormal results are displayed) Labs Reviewed  POCT URINALYSIS DIP (MANUAL ENTRY) - Abnormal; Notable for the following components:      Result Value   Ketones, POC UA trace (5) (*)    All other components within normal limits    EKG None  Radiology No results found.  Procedures Procedures (including critical care time)  Medications Ordered in UC Medications - No data to display  Initial Impression / Assessment and Plan / UC Course  I have reviewed the triage vital  signs and the nursing notes.  Pertinent labs & imaging results that were available during my care of the patient were reviewed by me and considered in my medical decision making (see chart for details).     UA: unremarkable Low concern for renal stone Hx and exam most c/w muscular strain Home care instructions provided below.   Final Clinical Impressions(s) / UC Diagnoses   Final diagnoses:  Acute bilateral low back pain  without sciatica  History of kidney stones     Discharge Instructions      You may take 500mg  acetaminophen every 4-6 hours or in combination with ibuprofen 400-600mg  every 6-8 hours as needed for pain and inflammation.  Robaxin (methocarbamol) is a muscle relaxer and may cause drowsiness. Do not drink alcohol, drive, or operate heavy machinery while taking.  Please follow up with family medicine in 1 week if not improving, sooner if worsening or new symptoms develop.     ED Prescriptions    Medication Sig Dispense Auth. Provider   methocarbamol (ROBAXIN) 500 MG tablet Take 1 tablet (500 mg total) by mouth 2 (two) times daily. 20 tablet Noe Gens, PA-C     Controlled Substance Prescriptions [place] Controlled Substance Registry consulted? Not Applicable   Tyrell Antonio 12/04/17 1406

## 2017-12-25 DIAGNOSIS — R739 Hyperglycemia, unspecified: Secondary | ICD-10-CM | POA: Diagnosis not present

## 2017-12-25 DIAGNOSIS — R1030 Lower abdominal pain, unspecified: Secondary | ICD-10-CM | POA: Diagnosis not present

## 2017-12-25 DIAGNOSIS — R195 Other fecal abnormalities: Secondary | ICD-10-CM | POA: Diagnosis not present

## 2017-12-25 MED FILL — CIPROFLOXACIN HCL 500 MG TA: 500 | 7 days supply | Qty: 14 | Fill #0

## 2017-12-25 MED FILL — metroNIDAZOLE 250 MG TABS: 250 | 7 days supply | Qty: 21 | Fill #0

## 2018-01-11 MED FILL — VALSARTAN-HCTZ 160-12.5 MG: 160-12.5 | 90 days supply | Qty: 90 | Fill #1

## 2018-01-18 MED FILL — ATORVASTATIN CALCIUM 20 MG: 20 | 90 days supply | Qty: 90 | Fill #1

## 2018-02-02 DIAGNOSIS — A09 Infectious gastroenteritis and colitis, unspecified: Secondary | ICD-10-CM | POA: Diagnosis not present

## 2018-02-03 MED FILL — PANTOPRAZOLE SOD DR 40 MG T: 40 | 84 days supply | Qty: 60 | Fill #1

## 2018-02-15 MED FILL — FAMOTIDINE 20 MG TABLET: 20 | 90 days supply | Qty: 90 | Fill #0

## 2018-02-24 MED FILL — SILDENAFIL CITRATE 20 MG TA: 20 | 10 days supply | Qty: 30 | Fill #0

## 2018-03-29 DIAGNOSIS — S40019A Contusion of unspecified shoulder, initial encounter: Secondary | ICD-10-CM | POA: Diagnosis not present

## 2018-04-12 MED FILL — ATORVASTATIN CALCIUM 20 MG: 20 | 90 days supply | Qty: 90 | Fill #2

## 2018-04-12 MED FILL — VALSARTAN-HCTZ 160-12.5 MG: 160-12.5 | 90 days supply | Qty: 90 | Fill #2

## 2018-05-13 DIAGNOSIS — Q892 Congenital malformations of other endocrine glands: Secondary | ICD-10-CM | POA: Diagnosis not present

## 2018-05-13 MED FILL — CLINDAMYCIN HCL 300 MG CAP: 300 | 10 days supply | Qty: 60 | Fill #0

## 2018-05-17 MED FILL — FAMOTIDINE 20 MG TABLET: 20 | 90 days supply | Qty: 90 | Fill #1

## 2018-05-18 DIAGNOSIS — E782 Mixed hyperlipidemia: Secondary | ICD-10-CM | POA: Diagnosis not present

## 2018-05-18 DIAGNOSIS — Z1389 Encounter for screening for other disorder: Secondary | ICD-10-CM | POA: Diagnosis not present

## 2018-05-18 DIAGNOSIS — I10 Essential (primary) hypertension: Secondary | ICD-10-CM | POA: Diagnosis not present

## 2018-05-18 DIAGNOSIS — Z0000 Encounter for general adult medical examination without abnormal findings: Secondary | ICD-10-CM | POA: Diagnosis not present

## 2018-05-19 MED FILL — DOXYCYCLINE HYC 100 MG CAPS: 100 | 10 days supply | Qty: 20 | Fill #0

## 2018-06-21 MED FILL — SILDENAFIL CITRATE 20 MG TA: 20 | 10 days supply | Qty: 30 | Fill #1

## 2018-06-30 DIAGNOSIS — R972 Elevated prostate specific antigen [PSA]: Secondary | ICD-10-CM | POA: Diagnosis not present

## 2018-06-30 DIAGNOSIS — M7580 Other shoulder lesions, unspecified shoulder: Secondary | ICD-10-CM | POA: Diagnosis not present

## 2018-07-19 MED FILL — ATORVASTATIN 20 MG TABLET: 20 | 90 days supply | Qty: 90 | Fill #0 | Status: TO

## 2018-07-19 MED FILL — PANTOPRAZOLE SOD DR 40 MG T: 40 | 84 days supply | Qty: 60 | Fill #2

## 2018-07-19 MED FILL — VALSARTAN-HCTZ 160-12.5 MG: 160-12.5 | 90 days supply | Qty: 90 | Fill #0 | Status: TO

## 2018-08-16 MED FILL — FAMOTIDINE 20 MG TABLET: 20 | 90 days supply | Qty: 90 | Fill #2

## 2018-09-14 MED FILL — ATORVASTATIN 20 MG TABLET: 20 | 90 days supply | Qty: 90 | Fill #0

## 2018-09-14 MED FILL — VALSARTAN-HCTZ 160-12.5 MG: 160-12.5 | 30 days supply | Qty: 30 | Fill #0

## 2018-09-17 DIAGNOSIS — R972 Elevated prostate specific antigen [PSA]: Secondary | ICD-10-CM | POA: Diagnosis not present

## 2018-09-17 DIAGNOSIS — N402 Nodular prostate without lower urinary tract symptoms: Secondary | ICD-10-CM | POA: Diagnosis not present

## 2018-09-17 MED FILL — levoFLOXacin 750 MG TABS: 750 | 1 days supply | Qty: 1 | Fill #0

## 2018-10-04 MED FILL — SILDENAFIL CITRATE 20 MG TA: 20 | 10 days supply | Qty: 30 | Fill #0

## 2018-10-04 MED FILL — PANTOPRAZOLE SOD DR 40 MG T: 40 | 84 days supply | Qty: 60 | Fill #0

## 2018-11-01 DIAGNOSIS — R972 Elevated prostate specific antigen [PSA]: Secondary | ICD-10-CM | POA: Diagnosis not present

## 2018-11-05 ENCOUNTER — Other Ambulatory Visit: Payer: Self-pay | Admitting: Urology

## 2018-11-05 ENCOUNTER — Other Ambulatory Visit (HOSPITAL_COMMUNITY): Payer: Self-pay | Admitting: Urology

## 2018-11-05 DIAGNOSIS — C61 Malignant neoplasm of prostate: Secondary | ICD-10-CM

## 2018-11-09 DIAGNOSIS — N281 Cyst of kidney, acquired: Secondary | ICD-10-CM | POA: Diagnosis not present

## 2018-11-09 DIAGNOSIS — C61 Malignant neoplasm of prostate: Secondary | ICD-10-CM | POA: Diagnosis not present

## 2018-11-15 ENCOUNTER — Encounter (HOSPITAL_COMMUNITY)
Admission: RE | Admit: 2018-11-15 | Discharge: 2018-11-15 | Disposition: A | Discharge status: Home / Self Care | Payer: Medicare Other | Source: Ambulatory Visit | Attending: Urology | Admitting: Urology

## 2018-11-15 ENCOUNTER — Other Ambulatory Visit: Payer: Self-pay

## 2018-11-15 DIAGNOSIS — C61 Malignant neoplasm of prostate: Secondary | ICD-10-CM | POA: Diagnosis not present

## 2018-11-15 MED ORDER — TECHNETIUM TC 99M MEDRONATE IV KIT
20.9000 | PACK | Freq: Once | INTRAVENOUS | Status: AC
  Administered 2018-11-15: 20.9 via INTRAVENOUS

## 2018-11-17 MED FILL — VALSARTAN-HCTZ 160-12.5 MG: 160-12.5 | 30 days supply | Qty: 30 | Fill #0

## 2018-11-17 MED FILL — SM ACID REDUCER 20 MG TAB: 20 | 75 days supply | Qty: 75 | Fill #0

## 2018-11-18 DIAGNOSIS — C61 Malignant neoplasm of prostate: Secondary | ICD-10-CM | POA: Diagnosis not present

## 2018-11-30 DIAGNOSIS — C61 Malignant neoplasm of prostate: Secondary | ICD-10-CM | POA: Diagnosis not present

## 2018-12-02 DIAGNOSIS — H43811 Vitreous degeneration, right eye: Secondary | ICD-10-CM | POA: Diagnosis not present

## 2018-12-07 DIAGNOSIS — M6281 Muscle weakness (generalized): Secondary | ICD-10-CM | POA: Diagnosis not present

## 2018-12-07 DIAGNOSIS — N393 Stress incontinence (female) (male): Secondary | ICD-10-CM | POA: Diagnosis not present

## 2018-12-07 DIAGNOSIS — C61 Malignant neoplasm of prostate: Secondary | ICD-10-CM | POA: Diagnosis not present

## 2018-12-07 DIAGNOSIS — M62838 Other muscle spasm: Secondary | ICD-10-CM | POA: Diagnosis not present

## 2018-12-13 ENCOUNTER — Other Ambulatory Visit: Payer: Self-pay | Admitting: Urology

## 2018-12-13 MED FILL — ATORVASTATIN 20 MG TABLET: 20 | 90 days supply | Qty: 90 | Fill #0

## 2018-12-13 MED FILL — VALSARTAN-HCTZ 160-12.5 MG: 160-12.5 | 30 days supply | Qty: 30 | Fill #1

## 2018-12-22 DIAGNOSIS — M6281 Muscle weakness (generalized): Secondary | ICD-10-CM | POA: Diagnosis not present

## 2018-12-22 DIAGNOSIS — C61 Malignant neoplasm of prostate: Secondary | ICD-10-CM | POA: Diagnosis not present

## 2018-12-22 DIAGNOSIS — N393 Stress incontinence (female) (male): Secondary | ICD-10-CM | POA: Diagnosis not present

## 2018-12-22 DIAGNOSIS — M62838 Other muscle spasm: Secondary | ICD-10-CM | POA: Diagnosis not present

## 2018-12-23 DIAGNOSIS — H43811 Vitreous degeneration, right eye: Secondary | ICD-10-CM | POA: Diagnosis not present

## 2018-12-27 MED FILL — SILDENAFIL CITRATE 20 MG TA: 20 | 10 days supply | Qty: 30 | Fill #0

## 2019-01-17 MED FILL — VALSARTAN-HCTZ 160-12.5 MG: 160-12.5 | 30 days supply | Qty: 30 | Fill #2

## 2019-02-02 NOTE — Patient Instructions (Addendum)
DUE TO COVID-19 ONLY ONE VISITOR IS ALLOWED TO COME WITH YOU AND STAY IN THE WAITING ROOM ONLY DURING PRE OP AND PROCEDURE DAY OF SURGERY. THE 1 VISITOR MAY VISIT WITH YOU AFTER SURGERY IN YOUR PRIVATE ROOM DURING VISITING HOURS ONLY!  YOU NEED TO HAVE A COVID 19 TEST ON_tues 9/8/20______ @___8 :15____, THIS TEST MUST BE DONE BEFORE SURGERY, COME  [address],  [place] , [address].  ([place]) ONCE YOUR COVID TEST IS COMPLETED, PLEASE BEGIN THE QUARANTINE INSTRUCTIONS AS OUTLINED IN YOUR HANDOUT.                Dillon Arias    Your procedure is scheduled on: 02/10/19    Report to [facility] Main  Entrance   Report to admitting at    9:15 AM     Call this number if you have problems the morning of surgery [phone]    Remember: Do not eat food or drink liquids :After Midnight.  BRUSH YOUR TEETH MORNING OF SURGERY AND RINSE YOUR MOUTH OUT, NO CHEWING GUM CANDY OR MINTS.     Take these medicines the morning of surgery with A SIP OF WATER:                                  You may not have any metal on your body including hair pins and              piercings  Do not wear jewelry,, lotions, powders or  deodorant                       Men may shave face and neck.   Do not bring valuables to the hospital. [place].  Contacts, dentures or bridgework may not be worn into surgery.       Special Instructions: N/A              Please read over the following fact sheets you were given: _____________________________________________________________________             [facility] - Preparing for Surgery Before surgery, you can play an important role.   Because skin is not sterile, your skin needs to be as free of germs as possible .  You can reduce the number of germs on your skin by washing with CHG (chlorahexidine gluconate) soap before surgery.   CHG is an antiseptic cleaner which kills germs  and bonds with the skin to continue killing germs even after washing. Please DO NOT use if you have an allergy to CHG or antibacterial soaps.   If your skin becomes reddened/irritated stop using the CHG and inform your nurse when you arrive at Short Stay.  You may shave your face/neck. Please follow these instructions carefully:  1.  Shower with CHG Soap the night before surgery and the  morning of Surgery.  2.  If you choose to wash your hair, wash your hair first as usual with your  normal  shampoo.  3.  After you shampoo, rinse your hair and body thoroughly to remove the  shampoo.  4.  Use CHG as you would any other liquid soap.  You can apply chg directly  to the skin and wash                       Gently with a scrungie or clean washcloth.  5.  Apply the CHG Soap to your body ONLY FROM THE NECK DOWN.   Do not use on face/ open                           Wound or open sores. Avoid contact with eyes, ears mouth and genitals (private parts).                       Wash face,  Genitals (private parts) with your normal soap.             6.  Wash thoroughly, paying special attention to the area where your surgery  will be performed.  7.  Thoroughly rinse your body with warm water from the neck down.  8.  DO NOT shower/wash with your normal soap after using and rinsing off  the CHG Soap.                9.  Pat yourself dry with a clean towel.            10.  Wear clean pajamas.            11.  Place clean sheets on your bed the night of your first shower and do not  sleep with pets. Day of Surgery : Do not apply any lotions/deodorants the morning of surgery.  Please wear clean clothes to the hospital/surgery center.  FAILURE TO FOLLOW THESE INSTRUCTIONS MAY RESULT IN THE CANCELLATION OF YOUR SURGERY PATIENT SIGNATURE_________________________________  NURSE  SIGNATURE__________________________________  ________________________________________________________________________   Dillon Arias  An incentive spirometer is a tool that can help keep your lungs clear and active. This tool measures how well you are filling your lungs with each breath. Taking long deep breaths may help reverse or decrease the chance of developing breathing (pulmonary) problems (especially infection) following:  A long period of time when you are unable to move or be active. BEFORE THE PROCEDURE   If the spirometer includes an indicator to show your best effort, your nurse or respiratory therapist will set it to a desired goal.  If possible, sit up straight or lean slightly forward. Try not to slouch.  Hold the incentive spirometer in an upright position. INSTRUCTIONS FOR USE  1. Sit on the edge of your bed if possible, or sit up as far as you can in bed or on a chair. 2. Hold the incentive spirometer in an upright position. 3. Breathe out normally. 4. Place the mouthpiece in your mouth and seal your lips tightly around it. 5. Breathe in slowly and as deeply as possible, raising the piston or the ball toward the top of the column. 6. Hold your breath for 3-5 seconds or for as long as possible. Allow the piston or ball to fall to the bottom of the column. 7. Remove the mouthpiece from your mouth and breathe out normally. 8. Rest for a few seconds and repeat Steps 1 through 7 at least 10 times every 1-2 hours when you are awake. Take your time and take a few normal breaths between deep breaths. 9. The spirometer may include an indicator to show  your best effort. Use the indicator as a goal to work toward during each repetition. 10. After each set of 10 deep breaths, practice coughing to be sure your lungs are clear. If you have an incision (the cut made at the time of surgery), support your incision when coughing by placing a pillow or rolled up towels firmly  against it. Once you are able to get out of bed, walk around indoors and cough well. You may stop using the incentive spirometer when instructed by your caregiver.  RISKS AND COMPLICATIONS  Take your time so you do not get dizzy or light-headed.  If you are in pain, you may need to take or ask for pain medication before doing incentive spirometry. It is harder to take a deep breath if you are having pain. AFTER USE  Rest and breathe slowly and easily.  It can be helpful to keep track of a log of your progress. Your caregiver can provide you with a simple table to help with this. If you are using the spirometer at home, follow these instructions: [place] IF:   You are having difficultly using the spirometer.  You have trouble using the spirometer as often as instructed.  Your pain medication is not giving enough relief while using the spirometer.  You develop fever of 100.5 F (38.1 C) or higher. SEEK IMMEDIATE MEDICAL CARE IF:   You cough up bloody sputum that had not been present before.  You develop fever of 102 F (38.9 C) or greater.  You develop worsening pain at or near the incision site. MAKE SURE YOU:   Understand these instructions.  Will watch your condition.  Will get help right away if you are not doing well or get worse. Document Released: 09/29/2006 Document Revised: 08/11/2011 Document Reviewed: 11/30/2006 [facility] Patient Information 2014 [place], [place].   ________________________________________________________________________

## 2019-02-03 ENCOUNTER — Encounter (HOSPITAL_COMMUNITY): Payer: Self-pay

## 2019-02-03 ENCOUNTER — Other Ambulatory Visit: Payer: Self-pay

## 2019-02-03 ENCOUNTER — Encounter (HOSPITAL_COMMUNITY)
Admission: RE | Admit: 2019-02-03 | Discharge: 2019-02-03 | Disposition: A | Discharge status: Home / Self Care | Payer: Medicare Other | Source: Ambulatory Visit | Attending: Urology | Admitting: Urology

## 2019-02-03 ENCOUNTER — Encounter (INDEPENDENT_AMBULATORY_CARE_PROVIDER_SITE_OTHER): Payer: Self-pay

## 2019-02-03 DIAGNOSIS — Z0184 Encounter for antibody response examination: Secondary | ICD-10-CM | POA: Insufficient documentation

## 2019-02-03 DIAGNOSIS — Z01812 Encounter for preprocedural laboratory examination: Secondary | ICD-10-CM | POA: Diagnosis not present

## 2019-02-03 DIAGNOSIS — I10 Essential (primary) hypertension: Secondary | ICD-10-CM | POA: Insufficient documentation

## 2019-02-03 LAB — BASIC METABOLIC PANEL
Anion gap: 8 (ref 5–15)
BUN: 17 mg/dL (ref 8–23)
CO2: 25 mmol/L (ref 22–32)
Calcium: 8.6 mg/dL — ABNORMAL LOW (ref 8.9–10.3)
Chloride: 104 mmol/L (ref 98–111)
Creatinine, Ser: 0.74 mg/dL (ref 0.61–1.24)
GFR calc Af Amer: >60 mL/min (ref >60)
GFR calc non Af Amer: >60 mL/min (ref >60)
Glucose, Bld: 114 mg/dL — ABNORMAL HIGH (ref 70–99)
Potassium: 3.7 mmol/L (ref 3.5–5.1)
Sodium: 137 mmol/L (ref 135–145)

## 2019-02-03 LAB — CBC
HCT: 47.5 % (ref 39.0–52.0)
Hemoglobin: 15.8 g/dL (ref 13.0–17.0)
MCH: 31.7 pg (ref 26.0–34.0)
MCHC: 33.3 g/dL (ref 30.0–36.0)
MCV: 95.4 fL (ref 80.0–100.0)
Platelets: 256 10*3/uL (ref 150–400)
RBC: 4.98 MIL/uL (ref 4.22–5.81)
RDW: 12.9 % (ref 11.5–15.5)
WBC: 8.6 10*3/uL (ref 4.0–10.5)
nRBC: 0 % (ref 0.0–0.2)

## 2019-02-03 LAB — ABO/RH: ABO/RH(D): A POS

## 2019-02-03 NOTE — Progress Notes (Addendum)
PCP - Dr. Inda Merlin Cardiologist - Dr. Linard Millers  Chest x-ray - no EKG - 02/03/19 Stress Test - no ECHO - no Cardiac Cath - no  Sleep Study - no CPAP -   Fasting Blood Sugar - NA Checks Blood Sugar _____ times a day  Blood Thinner Instructions: Dr. Lynne Logan office  Aspirin Instructions:stop 5 days prior to surgery Last Dose:01/31/19  Anesthesia review:   Patient denies shortness of breath, fever, cough and chest pain at PAT appointment yes  Patient verbalized understanding of instructions that were given to them at the PAT appointment. Patient was also instructed that they will need to review over the PAT instructions again at home before surgery. yes

## 2019-02-08 ENCOUNTER — Other Ambulatory Visit (HOSPITAL_COMMUNITY)
Admission: RE | Admit: 2019-02-08 | Discharge: 2019-02-08 | Disposition: A | Discharge status: Home / Self Care | Payer: Medicare Other | Source: Ambulatory Visit | Attending: Urology | Admitting: Urology

## 2019-02-08 DIAGNOSIS — Z01812 Encounter for preprocedural laboratory examination: Secondary | ICD-10-CM | POA: Diagnosis not present

## 2019-02-08 DIAGNOSIS — Z20828 Contact with and (suspected) exposure to other viral communicable diseases: Secondary | ICD-10-CM | POA: Diagnosis not present

## 2019-02-08 LAB — SARS CORONAVIRUS 2 (TAT 6-24 HRS): SARS Coronavirus 2: NEGATIVE

## 2019-02-09 NOTE — H&P (Signed)
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 This prompted a TRUS biopsy by Dr. Diona Fanti on 11/01/18 demonstrating Gleason 4+3=7 adenocarcinoma with 4 out of 12 biopsies cores positive for malignancy with the highest volume and highest grade disease correlating to his left base nodule.   Family history: His brother has a history of prostate cancer and underwent surgical therapy a few years ago.   Imaging studies:  CT abd/pelvis (11/09/18): Negative for metastases. Two left non-obstructing renal calculi.  Bone scan (11/15/18): Degenerative uptake but no evidence to suggest metastatic disease.   PMH: He has a history of hypertension, hyperlipidemia, and GERD.  PSH: Hernia repair in 1972.   TNM stage: cT2a N0 M0  PSA: 4.06  Gleason score: 4+3=7 (Grade group 3)  Biopsy (11/01/18): 4/12 cores positive  Left: L lateral apex (50%, 3+4=7), L lateral mid (80%, 4+3=7), L lateral base (90%, 4+3=7, PNI)  Right: R apex (5%, 3+3=6)  Prostate volume: 47.2 cc   Nomogram  OC disease: 47%  EPE: 49%  SVI: 7%  LNI: 10%  PFS (5 year, 10 year): 70%, 55%   Urinary function: IPSS is 2.  Erectile function: SHIM score is 15. He estimates that he can get erections approximately 70% of time. He does respond to sildenafil. However, erectile function is a lower priority for him and his wife.     ALLERGIES: Codeine Derivatives terbinafine    MEDICATIONS: [place]ra Allergy  Aspir 81  Atorvastatin Calcium 20 mg tablet  Famotidine 20 mg tablet  Multivitamin  Pantoprazole Sodium 40 mg tablet, delayed release  Valsartan-Hydrochlorothiazide 160 mg-12.5 mg tablet     GU PSH: Cystoscopy And Treatment - 2008 Locm 300-399Mg /Ml Iodine,1Ml - 11/09/2018 Prostate Needle Biopsy - 11/01/2018 Vasectomy - 2008       [place] Notes: Surgery Of Male Genitalia Vasectomy, Inguinal Hernia Repair, Cystoscopy With Dilation  Of Bladder Under Local Anesthesia   NON-GU PSH: Hernia Repair - 1972 Remove Pilonidal Lesion - 1989 Remove Thyroid Duct Lesion - 2018 Repair Finger Tendon - 2018, 2015 Surgical Pathology, Gross And Microscopic Examination For Prostate Needle - 11/01/2018     GU PMH: Prostate Cancer, Intermediate risk PCa in 68 yo male w/ minimal voiding sx's and medium sized gland. Preexisting ED on sildenafil. [place] predictions: OCD 47% SV/LN involvement 7/10% 5/10 year cancer free progression 70/55% - 11/18/2018 Elevated PSA - 11/01/2018, Baseline PSA in 2017 was under 1, more recently PSA is averaged between 4 in 5. He does have a left-sided nodule, - 09/17/2018 Prostate nodule w/o LUTS - 09/17/2018 Balanitis, Balanitis - 2014 Encounter for Prostate Cancer screening, Prostate cancer screening - 2014 Renal calculus, Nephrolithiasis - 2014      PMH Notes:  1898-06-02 00:00:00 - Note: Normal Routine History And Physical Adult   NON-GU PMH: Personal history of other diseases of the circulatory system, History of hypertension - 2014 Personal history of other diseases of the digestive system, History of esophageal reflux - 2014 Personal history of other endocrine, nutritional and metabolic disease, History of hypercholesterolemia - 2014 Arthritis GERD    FAMILY HISTORY: 2 daughters - Daughter Coronary Artery Disease - Runs In Family Death In The Family Father - Father Death In The Family Mother - Mother Diabetes - Runs In Family Prostate Cancer - Brother Urologic Disorder - Mother, Father   SOCIAL HISTORY: Marital Status: Married  Preferred Language: [place]; Ethnicity: Not Hispanic Or Latino; Race: White Current Smoking Status: Patient has never smoked.   Tobacco Use Assessment Completed: Used Tobacco in last 30 days? Has never drank.  Drinks 3 caffeinated drinks per day. Patient's occupation Engineer, materials retired.    REVIEW OF SYSTEMS:    GU Review Male:   Patient denies frequent urination, hard  to postpone urination, burning/ pain with urination, get up at night to urinate, leakage of urine, stream starts and stops, trouble starting your streams, and have to strain to urinate .  Gastrointestinal (Lower):   Patient denies diarrhea and constipation.  Gastrointestinal (Upper):   Patient denies nausea and vomiting.  Constitutional:   Patient denies fever, night sweats, weight loss, and fatigue.  Skin:   Patient denies skin rash/ lesion and itching.  Eyes:   Patient denies blurred vision and double vision.  Ears/ Nose/ Throat:   Patient denies sore throat and sinus problems.  Hematologic/Lymphatic:   Patient denies swollen glands and easy bruising.  Cardiovascular:   Patient denies leg swelling and chest pains.  Respiratory:   Patient denies cough and shortness of breath.  Endocrine:   Patient denies excessive thirst.  Musculoskeletal:   Patient denies back pain and joint pain.  Neurological:   Patient denies headaches and dizziness.  Psychologic:   Patient denies depression and anxiety.   VITAL SIGNS:     Weight 218 lb / 98.88 kg  Height 67 in / 170.18 cm  BMI 34.1 kg/m    MULTI-SYSTEM PHYSICAL EXAMINATION:    Constitutional: Well-nourished. No physical deformities. Normally developed. Good grooming.  Neck: Neck symmetrical, not swollen. Normal tracheal position.  Respiratory: No labored breathing, no use of accessory muscles. Clear bilaterally.  Cardiovascular: Normal temperature, normal extremity pulses, no swelling, no varicosities. Regular rate and rhythm  Lymphatic: No enlargement of neck, axillae, groin.  Skin: No paleness, no jaundice, no cyanosis. No lesion, no ulcer, no rash.  Neurologic / Psychiatric: Oriented to time, oriented to place, oriented to person. No depression, no anxiety, no agitation.  Gastrointestinal: No mass, no tenderness, no rigidity, non obese abdomen.  Eyes: Normal conjunctivae. Normal eyelids.  Ears, Nose, Mouth, and Throat: Left ear no scars, no  lesions, no masses. Right ear no scars, no lesions, no masses. Nose no scars, no lesions, no masses. Normal hearing. Normal lips.  Musculoskeletal: Normal gait and station of head and neck.     PAST DATA REVIEWED:  Source Of History:  Patient  Lab Test Review:   PSA  Records Review:   Pathology Reports  Urine Test Review:   Urinalysis  X-Ray Review: C.T. Abdomen/Pelvis: Reviewed Films.  Bone Scan: Reviewed Films.     06/30/18 05/18/18 06/29/17 06/29/16 07/30/07 08/12/06 07/10/05  PSA  Total PSA 4.06 ng/dl 5.06 ng/dl 3.9 ng/dl 3.0 ng/dl 1.23  1.28  0.95    Notes:                     CLINICAL DATA: Prostate cancer.   EXAM:  NUCLEAR MEDICINE WHOLE BODY BONE SCAN   TECHNIQUE:  Whole body anterior and posterior images were obtained approximately  3 hours after intravenous injection of radiopharmaceutical.   RADIOPHARMACEUTICALS: 20.9 mCi Technetium-36m MDP IV   COMPARISON: CT 11/09/2018.   FINDINGS:  Bilateral renal function excretion. Mild increased activity noted  about both shoulder, the right knee and left first MTP joint.  Changes most likely degenerative. No evidence of metastatic disease.   IMPRESSION:  No  evidence of metastatic disease.    Electronically Signed  By: [place]  On: 11/15/2018 16:43   CLINICAL DATA: New diagnosis of prostate cancer. Positive biopsy  11/01/2018   EXAM:  CT ABDOMEN AND PELVIS WITH CONTRAST   TECHNIQUE:  Multidetector CT imaging of the abdomen and pelvis was performed  using the standard protocol following bolus administration of  intravenous contrast.   CONTRAST: 100 cc Omnipaque 300   COMPARISON: None.   FINDINGS:  Lower chest: The lung bases are clear of an acute process. No  worrisome pulmonary lesions or pleural effusion. The heart is normal  in size. No pericardial effusion. Coronary artery calcifications are  noted. The distal esophagus is grossly normal.   Hepatobiliary: Small low-attenuation lesion in  segment 3 measures 15  Hounsfield units and is consistent with a small cyst. No worrisome  hepatic lesions to suggest metastatic disease. The gallbladder is  normal. No common bile duct dilatation.   Pancreas: No mass, inflammation or ductal dilatation.   Spleen: Normal size. No focal lesions.   Adrenals/Urinary Tract: The adrenal glands and kidneys are  unremarkable except for 2 left-sided renal calculi and a simple  appearing left renal cyst. No worrisome renal lesions or  hydronephrosis. No collecting system abnormalities are identified on  the delayed images.   The bladder is unremarkable.   Stomach/Bowel: The stomach, duodenum, small bowel and colon are  unremarkable. No acute inflammatory changes, mass lesions or  obstructive findings. The terminal ileum and appendix are normal.  Moderate scattered diverticulosis but no definite findings for acute  diverticulitis.   Vascular/Lymphatic: Moderate atherosclerotic calcifications  involving the aorta and iliac arteries and branch vessels but no  focal aneurysm or dissection. The major venous structures are  patent. No mesenteric or retroperitoneal adenopathy to suggest  metastatic disease.   No pelvic or inguinal adenopathy.   Reproductive: The prostate gland is slightly enlarged but no obvious  mass. The seminal vesicles appear normal.   Other: There is a moderate-sized right inguinal hernia containing  fat. No inguinal mass or adenopathy.   Musculoskeletal: No worrisome bone lesions to suggest metastatic  disease. Advanced facet disease is noted in the lumbar spine. No  definite pars defects. Moderate degenerative changes involving both  hips.   IMPRESSION:  1. No CT findings to suggest metastatic prostate cancer.  2. Age advanced coronary artery calcifications and aortic and branch  vessel calcifications.  3. Simple left renal cyst and 2 left renal calculi.  4. Moderate-sized right inguinal hernia containing fat.   5. Colonic diverticulosis without findings for acute diverticulitis.    Electronically Signed  By: Marijo Sanes M.D.  On: 11/09/2018 11:04      ASSESSMENT:      ICD-10 Details  1 GU:   Prostate Cancer - C61    PLAN:          1. Prostate cancer: He does adamantly wished to proceed with surgical treatment and will be scheduled for a unilateral right nerve-sparing robot assisted laparoscopic radical prostatectomy and bilateral pelvic lymphadenectomy.

## 2019-02-10 ENCOUNTER — Encounter (HOSPITAL_COMMUNITY)
Admission: RE | Disposition: A | Discharge status: Home / Self Care | Payer: Self-pay | Source: Home / Self Care | Attending: Urology

## 2019-02-10 ENCOUNTER — Other Ambulatory Visit: Payer: Self-pay

## 2019-02-10 ENCOUNTER — Encounter (HOSPITAL_COMMUNITY): Payer: Self-pay | Admitting: Anesthesiology

## 2019-02-10 ENCOUNTER — Observation Stay (HOSPITAL_COMMUNITY)
Admission: RE | Admit: 2019-02-10 | Discharge: 2019-02-11 | Disposition: A | Discharge status: Home / Self Care | Payer: Medicare Other | Attending: Urology | Admitting: Urology

## 2019-02-10 ENCOUNTER — Ambulatory Visit (HOSPITAL_COMMUNITY): Payer: Medicare Other | Admitting: Anesthesiology

## 2019-02-10 ENCOUNTER — Ambulatory Visit (HOSPITAL_COMMUNITY): Payer: Medicare Other | Admitting: Physician Assistant

## 2019-02-10 DIAGNOSIS — Z7982 Long term (current) use of aspirin: Secondary | ICD-10-CM | POA: Insufficient documentation

## 2019-02-10 DIAGNOSIS — K219 Gastro-esophageal reflux disease without esophagitis: Secondary | ICD-10-CM | POA: Diagnosis not present

## 2019-02-10 DIAGNOSIS — E785 Hyperlipidemia, unspecified: Secondary | ICD-10-CM | POA: Diagnosis not present

## 2019-02-10 DIAGNOSIS — J0510 Acute epiglottitis without obstruction: Secondary | ICD-10-CM | POA: Diagnosis not present

## 2019-02-10 DIAGNOSIS — C61 Malignant neoplasm of prostate: Principal | ICD-10-CM | POA: Diagnosis present

## 2019-02-10 DIAGNOSIS — Z79899 Other long term (current) drug therapy: Secondary | ICD-10-CM | POA: Insufficient documentation

## 2019-02-10 DIAGNOSIS — I10 Essential (primary) hypertension: Secondary | ICD-10-CM | POA: Diagnosis not present

## 2019-02-10 HISTORY — PX: LYMPHADENECTOMY: SHX5960

## 2019-02-10 HISTORY — PX: ROBOT ASSISTED LAPAROSCOPIC RADICAL PROSTATECTOMY: SHX5141

## 2019-02-10 LAB — TYPE AND SCREEN
ABO/RH(D): A POS
Antibody Screen: NEGATIVE

## 2019-02-10 LAB — HEMOGLOBIN AND HEMATOCRIT, BLOOD
HCT: 45.2 % (ref 39.0–52.0)
Hemoglobin: 14.4 g/dL (ref 13.0–17.0)

## 2019-02-10 SURGERY — XI ROBOTIC ASSISTED LAPAROSCOPIC RADICAL PROSTATECTOMY LEVEL 2
Anesthesia: General

## 2019-02-10 MED ORDER — DIPHENHYDRAMINE HCL 50 MG/ML IJ SOLN: 12.5000 mg | Freq: Four times a day (QID) | INTRAMUSCULAR | Status: DC | PRN

## 2019-02-10 MED ORDER — DOCUSATE SODIUM 100 MG PO CAPS
100.0000 mg | ORAL_CAPSULE | Freq: Two times a day (BID) | ORAL | Status: DC
  Administered 2019-02-10 – 2019-02-11 (×2): 100 mg via ORAL
  Filled 2019-02-10 (×2): qty 1

## 2019-02-10 MED ORDER — MAGNESIUM CITRATE PO SOLN: 1.0000 | Freq: Once | ORAL | Status: DC

## 2019-02-10 MED ORDER — HEPARIN SODIUM (PORCINE) 1000 UNIT/ML IJ SOLN
INTRAMUSCULAR | Status: AC
  Filled 2019-02-10: qty 1

## 2019-02-10 MED ORDER — ONDANSETRON HCL 4 MG/2ML IJ SOLN
INTRAMUSCULAR | Status: DC | PRN
  Administered 2019-02-10: 4 mg via INTRAVENOUS

## 2019-02-10 MED ORDER — ZOLPIDEM TARTRATE 5 MG PO TABS: 5.0000 mg | ORAL_TABLET | Freq: Every evening | ORAL | Status: DC | PRN

## 2019-02-10 MED ORDER — KETAMINE HCL 10 MG/ML IJ SOLN
INTRAMUSCULAR | Status: DC | PRN
  Administered 2019-02-10: 20 mg via INTRAVENOUS
  Administered 2019-02-10: 10 mg via INTRAVENOUS

## 2019-02-10 MED ORDER — VALSARTAN-HYDROCHLOROTHIAZIDE 160-12.5 MG PO TABS: 1.0000 | ORAL_TABLET | Freq: Every day | ORAL | Status: DC

## 2019-02-10 MED ORDER — FLEET ENEMA 7-19 GM/118ML RE ENEM: 1.0000 | ENEMA | Freq: Once | RECTAL | Status: DC

## 2019-02-10 MED ORDER — HYDROMORPHONE HCL 1 MG/ML IJ SOLN
INTRAMUSCULAR | Status: AC
  Filled 2019-02-10: qty 1

## 2019-02-10 MED ORDER — DEXAMETHASONE SODIUM PHOSPHATE 10 MG/ML IJ SOLN
INTRAMUSCULAR | Status: DC | PRN
  Administered 2019-02-10: 10 mg via INTRAVENOUS

## 2019-02-10 MED ORDER — ONDANSETRON HCL 4 MG/2ML IJ SOLN: 4.0000 mg | INTRAMUSCULAR | Status: DC | PRN

## 2019-02-10 MED ORDER — IRBESARTAN 150 MG PO TABS
150.0000 mg | ORAL_TABLET | Freq: Every day | ORAL | Status: DC
  Administered 2019-02-10 – 2019-02-11 (×2): 150 mg via ORAL
  Filled 2019-02-10 (×2): qty 1

## 2019-02-10 MED ORDER — FENTANYL CITRATE (PF) 100 MCG/2ML IJ SOLN
25.0000 ug | INTRAMUSCULAR | Status: DC | PRN
  Administered 2019-02-10 (×3): 50 ug via INTRAVENOUS

## 2019-02-10 MED ORDER — LORATADINE 10 MG PO TABS
10.0000 mg | ORAL_TABLET | Freq: Every day | ORAL | Status: DC
  Administered 2019-02-11: 10 mg via ORAL
  Filled 2019-02-10: qty 1

## 2019-02-10 MED ORDER — INFLUENZA VAC A&B SA ADJ QUAD 0.5 ML IM PRSY
0.5000 mL | PREFILLED_SYRINGE | INTRAMUSCULAR | Status: DC
  Filled 2019-02-10: qty 0.5

## 2019-02-10 MED ORDER — MIDAZOLAM HCL 5 MG/5ML IJ SOLN
INTRAMUSCULAR | Status: DC | PRN
  Administered 2019-02-10: 2 mg via INTRAVENOUS

## 2019-02-10 MED ORDER — HYDROMORPHONE HCL 1 MG/ML IJ SOLN
INTRAMUSCULAR | Status: AC
  Administered 2019-02-10: 0.5 mg via INTRAVENOUS
  Filled 2019-02-10: qty 1

## 2019-02-10 MED ORDER — HYDROCHLOROTHIAZIDE 12.5 MG PO CAPS
12.5000 mg | ORAL_CAPSULE | Freq: Every day | ORAL | Status: DC
  Administered 2019-02-10 – 2019-02-11 (×2): 12.5 mg via ORAL
  Filled 2019-02-10 (×2): qty 1

## 2019-02-10 MED ORDER — ACETAMINOPHEN 325 MG PO TABS
650.0000 mg | ORAL_TABLET | ORAL | Status: DC | PRN
  Administered 2019-02-10: 650 mg via ORAL
  Filled 2019-02-10: qty 2

## 2019-02-10 MED ORDER — SODIUM CHLORIDE 0.9 % IV BOLUS
1000.0000 mL | Freq: Once | INTRAVENOUS | Status: AC
  Administered 2019-02-10: 1000 mL via INTRAVENOUS

## 2019-02-10 MED ORDER — DIPHENHYDRAMINE HCL 12.5 MG/5ML PO ELIX: 12.5000 mg | ORAL_SOLUTION | Freq: Four times a day (QID) | ORAL | Status: DC | PRN

## 2019-02-10 MED ORDER — SUCCINYLCHOLINE CHLORIDE 200 MG/10ML IV SOSY: PREFILLED_SYRINGE | INTRAVENOUS | Status: DC | PRN

## 2019-02-10 MED ORDER — BELLADONNA ALKALOIDS-OPIUM 16.2-60 MG RE SUPP
RECTAL | Status: AC
  Filled 2019-02-10: qty 1

## 2019-02-10 MED ORDER — ROCURONIUM BROMIDE 10 MG/ML (PF) SYRINGE
PREFILLED_SYRINGE | INTRAVENOUS | Status: DC | PRN
  Administered 2019-02-10: 20 mg via INTRAVENOUS
  Administered 2019-02-10: 70 mg via INTRAVENOUS
  Administered 2019-02-10: 10 mg via INTRAVENOUS
  Administered 2019-02-10 (×2): 20 mg via INTRAVENOUS
  Administered 2019-02-10: 10 mg via INTRAVENOUS

## 2019-02-10 MED ORDER — MIDAZOLAM HCL 2 MG/2ML IJ SOLN
INTRAMUSCULAR | Status: AC
  Filled 2019-02-10: qty 2

## 2019-02-10 MED ORDER — BUPIVACAINE HCL (PF) 0.25 % IJ SOLN
INTRAMUSCULAR | Status: AC
  Filled 2019-02-10: qty 30

## 2019-02-10 MED ORDER — BACITRACIN-NEOMYCIN-POLYMYXIN 400-5-5000 EX OINT: 1.0000 "application " | TOPICAL_OINTMENT | Freq: Three times a day (TID) | CUTANEOUS | Status: DC | PRN

## 2019-02-10 MED ORDER — CEFAZOLIN SODIUM-DEXTROSE 2-4 GM/100ML-% IV SOLN
2.0000 g | Freq: Once | INTRAVENOUS | Status: AC
  Administered 2019-02-10: 11:00:00 2 g via INTRAVENOUS
  Filled 2019-02-10: qty 100

## 2019-02-10 MED ORDER — SUGAMMADEX SODIUM 200 MG/2ML IV SOLN
INTRAVENOUS | Status: DC | PRN
  Administered 2019-02-10: 200 mg via INTRAVENOUS

## 2019-02-10 MED ORDER — ATORVASTATIN CALCIUM 20 MG PO TABS
20.0000 mg | ORAL_TABLET | Freq: Every day | ORAL | Status: DC
  Administered 2019-02-10: 20 mg via ORAL
  Filled 2019-02-10: qty 1

## 2019-02-10 MED ORDER — KCL IN DEXTROSE-NACL 20-5-0.45 MEQ/L-%-% IV SOLN
INTRAVENOUS | Status: DC
  Administered 2019-02-10 – 2019-02-11 (×2): via INTRAVENOUS
  Filled 2019-02-10 (×2): qty 1000

## 2019-02-10 MED ORDER — BUPIVACAINE HCL (PF) 0.25 % IJ SOLN
INTRAMUSCULAR | Status: DC | PRN
  Administered 2019-02-10: 30 mL

## 2019-02-10 MED ORDER — PANTOPRAZOLE SODIUM 40 MG PO TBEC
40.0000 mg | DELAYED_RELEASE_TABLET | Freq: Every evening | ORAL | Status: DC
  Administered 2019-02-10: 40 mg via ORAL
  Filled 2019-02-10: qty 1

## 2019-02-10 MED ORDER — LIDOCAINE 2% (20 MG/ML) 5 ML SYRINGE
INTRAMUSCULAR | Status: DC | PRN
  Administered 2019-02-10: 100 mg via INTRAVENOUS

## 2019-02-10 MED ORDER — TRAMADOL HCL 50 MG PO TABS: 50.0000 mg | ORAL_TABLET | Freq: Four times a day (QID) | ORAL | 0 refills | Status: DC | PRN

## 2019-02-10 MED ORDER — SODIUM CHLORIDE 0.9 % IR SOLN
Status: DC | PRN
  Administered 2019-02-10: 1000 mL

## 2019-02-10 MED ORDER — EPHEDRINE 5 MG/ML INJ
INTRAVENOUS | Status: AC
  Filled 2019-02-10: qty 10

## 2019-02-10 MED ORDER — FAMOTIDINE 20 MG PO TABS
20.0000 mg | ORAL_TABLET | Freq: Every day | ORAL | Status: DC
  Administered 2019-02-11: 20 mg via ORAL
  Filled 2019-02-10 (×2): qty 1

## 2019-02-10 MED ORDER — FENTANYL CITRATE (PF) 100 MCG/2ML IJ SOLN
INTRAMUSCULAR | Status: AC
  Filled 2019-02-10: qty 4

## 2019-02-10 MED ORDER — PROPOFOL 10 MG/ML IV BOLUS
INTRAVENOUS | Status: AC
  Filled 2019-02-10: qty 20

## 2019-02-10 MED ORDER — HYDROMORPHONE HCL 1 MG/ML IJ SOLN
0.2500 mg | INTRAMUSCULAR | Status: DC | PRN
  Administered 2019-02-10 (×4): 0.5 mg via INTRAVENOUS

## 2019-02-10 MED ORDER — ONDANSETRON HCL 4 MG/2ML IJ SOLN: 4.0000 mg | Freq: Once | INTRAMUSCULAR | Status: DC | PRN

## 2019-02-10 MED ORDER — KETOROLAC TROMETHAMINE 30 MG/ML IJ SOLN
INTRAMUSCULAR | Status: AC
  Filled 2019-02-10: qty 1

## 2019-02-10 MED ORDER — STERILE WATER FOR IRRIGATION IR SOLN
Status: DC | PRN
  Administered 2019-02-10: 1000 mL

## 2019-02-10 MED ORDER — PROPOFOL 10 MG/ML IV BOLUS
INTRAVENOUS | Status: DC | PRN
  Administered 2019-02-10: 140 mg via INTRAVENOUS

## 2019-02-10 MED ORDER — LACTATED RINGERS IV SOLN
INTRAVENOUS | Status: DC
  Administered 2019-02-10 (×2): via INTRAVENOUS

## 2019-02-10 MED ORDER — LACTATED RINGERS IV SOLN
INTRAVENOUS | Status: DC | PRN
  Administered 2019-02-10: 1000 mL

## 2019-02-10 MED ORDER — KETOROLAC TROMETHAMINE 15 MG/ML IJ SOLN
15.0000 mg | Freq: Four times a day (QID) | INTRAMUSCULAR | Status: DC
  Administered 2019-02-10 – 2019-02-11 (×5): 15 mg via INTRAVENOUS
  Filled 2019-02-10 (×4): qty 1

## 2019-02-10 MED ORDER — KETOROLAC TROMETHAMINE 15 MG/ML IJ SOLN
INTRAMUSCULAR | Status: AC
  Filled 2019-02-10: qty 1

## 2019-02-10 MED ORDER — MORPHINE SULFATE (PF) 2 MG/ML IV SOLN: 2.0000 mg | INTRAVENOUS | Status: DC | PRN

## 2019-02-10 MED ORDER — BELLADONNA ALKALOIDS-OPIUM 16.2-60 MG RE SUPP
1.0000 | Freq: Four times a day (QID) | RECTAL | Status: DC | PRN
  Administered 2019-02-10 (×2): 1 via RECTAL
  Filled 2019-02-10: qty 1

## 2019-02-10 MED ORDER — ROCURONIUM BROMIDE 10 MG/ML (PF) SYRINGE
PREFILLED_SYRINGE | INTRAVENOUS | Status: AC
  Filled 2019-02-10: qty 10

## 2019-02-10 MED ORDER — CEFAZOLIN SODIUM-DEXTROSE 1-4 GM/50ML-% IV SOLN
1.0000 g | Freq: Three times a day (TID) | INTRAVENOUS | Status: AC
  Administered 2019-02-10 – 2019-02-11 (×2): 1 g via INTRAVENOUS
  Filled 2019-02-10 (×2): qty 50

## 2019-02-10 MED ORDER — SULFAMETHOXAZOLE-TRIMETHOPRIM 800-160 MG PO TABS: 1.0000 | ORAL_TABLET | Freq: Two times a day (BID) | ORAL | 0 refills | Status: DC

## 2019-02-10 MED ORDER — BUPIVACAINE-EPINEPHRINE 0.25% -1:200000 IJ SOLN
50.0000 mL | Freq: Once | INTRAMUSCULAR | Status: DC
  Filled 2019-02-10: qty 50

## 2019-02-10 MED ORDER — FENTANYL CITRATE (PF) 100 MCG/2ML IJ SOLN
INTRAMUSCULAR | Status: DC | PRN
  Administered 2019-02-10: 25 ug via INTRAVENOUS
  Administered 2019-02-10: 50 ug via INTRAVENOUS
  Administered 2019-02-10: 25 ug via INTRAVENOUS
  Administered 2019-02-10: 50 ug via INTRAVENOUS
  Administered 2019-02-10: 100 ug via INTRAVENOUS

## 2019-02-10 MED ORDER — FENTANYL CITRATE (PF) 250 MCG/5ML IJ SOLN
INTRAMUSCULAR | Status: AC
  Filled 2019-02-10: qty 5

## 2019-02-10 MED FILL — SULFAMETHOXAZOLE-TMP DS TAB: 800-160 | 3 days supply | Qty: 6 | Fill #0

## 2019-02-10 MED FILL — traMADol HCL 50 MG TABS: 50 | 3 days supply | Qty: 20 | Fill #0

## 2019-02-10 SURGICAL SUPPLY — 60 items
ADH SKN CLS APL DERMABOND .7 (GAUZE/BANDAGES/DRESSINGS) ×2
APL PRP STRL LF DISP 70% ISPRP (MISCELLANEOUS) ×2
APL SWBSTK 6 STRL LF DISP (MISCELLANEOUS) ×2
APPLICATOR COTTON TIP 6 STRL (MISCELLANEOUS) ×2 IMPLANT
APPLICATOR COTTON TIP 6IN STRL (MISCELLANEOUS) ×3
CATH FOLEY 2WAY SLVR 18FR 30CC (CATHETERS) ×3 IMPLANT
CATH ROBINSON RED A/P 16FR (CATHETERS) ×3 IMPLANT
CATH ROBINSON RED A/P 8FR (CATHETERS) ×3 IMPLANT
CATH TIEMANN FOLEY 18FR 5CC (CATHETERS) ×3 IMPLANT
CHLORAPREP W/TINT 26 (MISCELLANEOUS) ×3 IMPLANT
CLIP VESOLOCK LG 6/CT PURPLE (CLIP) ×6 IMPLANT
COVER SURGICAL LIGHT HANDLE (MISCELLANEOUS) ×3 IMPLANT
COVER TIP SHEARS 8 DVNC (MISCELLANEOUS) ×2 IMPLANT
COVER TIP SHEARS 8MM DA VINCI (MISCELLANEOUS) ×1
COVER WAND RF STERILE (DRAPES) IMPLANT
CUTTER ECHEON FLEX ENDO 45 340 (ENDOMECHANICALS) ×3 IMPLANT
DECANTER SPIKE VIAL GLASS SM (MISCELLANEOUS) ×3 IMPLANT
DERMABOND ADVANCED (GAUZE/BANDAGES/DRESSINGS) ×1
DERMABOND ADVANCED .7 DNX12 (GAUZE/BANDAGES/DRESSINGS) ×2 IMPLANT
DRAPE ARM DVNC X/XI (DISPOSABLE) ×8 IMPLANT
DRAPE COLUMN DVNC XI (DISPOSABLE) ×2 IMPLANT
DRAPE DA VINCI XI ARM (DISPOSABLE) ×4
DRAPE DA VINCI XI COLUMN (DISPOSABLE) ×1
DRAPE SURG IRRIG POUCH 19X23 (DRAPES) ×3 IMPLANT
DRSG TEGADERM 4X4.75 (GAUZE/BANDAGES/DRESSINGS) ×3 IMPLANT
ELECT PENCIL ROCKER SW 15FT (MISCELLANEOUS) ×2 IMPLANT
ELECT REM PT RETURN 15FT ADLT (MISCELLANEOUS) ×3 IMPLANT
GLOVE BIO SURGEON STRL SZ 6.5 (GLOVE) ×3 IMPLANT
GLOVE BIOGEL M STRL SZ7.5 (GLOVE) ×6 IMPLANT
GOWN STRL REUS W/TWL LRG LVL3 (GOWN DISPOSABLE) ×9 IMPLANT
HOLDER FOLEY CATH W/STRAP (MISCELLANEOUS) ×3 IMPLANT
IRRIG SUCT STRYKERFLOW 2 WTIP (MISCELLANEOUS) ×3
IRRIGATION SUCT STRKRFLW 2 WTP (MISCELLANEOUS) ×2 IMPLANT
IV LACTATED RINGERS 1000ML (IV SOLUTION) ×3 IMPLANT
KIT TURNOVER KIT A (KITS) IMPLANT
NDL SAFETY ECLIPSE 18X1.5 (NEEDLE) ×2 IMPLANT
NEEDLE HYPO 18GX1.5 SHARP (NEEDLE) ×3
PACK ROBOT UROLOGY CUSTOM (CUSTOM PROCEDURE TRAY) ×3 IMPLANT
RELOAD STAPLE 45 4.1 GRN THCK (STAPLE) ×2 IMPLANT
SEAL CANN UNIV 5-8 DVNC XI (MISCELLANEOUS) ×8 IMPLANT
SEAL XI 5MM-8MM UNIVERSAL (MISCELLANEOUS) ×4
SOLUTION ELECTROLUBE (MISCELLANEOUS) ×3 IMPLANT
STAPLE RELOAD 45 GRN (STAPLE) ×2 IMPLANT
STAPLE RELOAD 45MM GREEN (STAPLE) ×3
SUT ETHILON 3 0 PS 1 (SUTURE) ×3 IMPLANT
SUT MNCRL 3 0 RB1 (SUTURE) ×2 IMPLANT
SUT MNCRL 3 0 VIOLET RB1 (SUTURE) ×2 IMPLANT
SUT MNCRL AB 4-0 PS2 18 (SUTURE) ×6 IMPLANT
SUT MONOCRYL 3 0 RB1 (SUTURE) ×2
SUT VIC AB 0 CT1 27 (SUTURE) ×3
SUT VIC AB 0 CT1 27XBRD ANTBC (SUTURE) ×2 IMPLANT
SUT VIC AB 0 UR5 27 (SUTURE) ×3 IMPLANT
SUT VIC AB 2-0 SH 27 (SUTURE) ×3
SUT VIC AB 2-0 SH 27X BRD (SUTURE) ×2 IMPLANT
SUT VIC AB 3-0 SH 27 (SUTURE) ×3
SUT VIC AB 3-0 SH 27XBRD (SUTURE) IMPLANT
SUT VICRYL 0 UR6 27IN ABS (SUTURE) ×6 IMPLANT
SYR 27GX1/2 1ML LL SAFETY (SYRINGE) ×3 IMPLANT
TOWEL OR NON WOVEN STRL DISP B (DISPOSABLE) ×3 IMPLANT
WATER STERILE IRR 1000ML POUR (IV SOLUTION) ×3 IMPLANT

## 2019-02-10 NOTE — Discharge Instructions (Signed)

## 2019-02-10 NOTE — Anesthesia Procedure Notes (Signed)
Procedure Name: Intubation Date/Time: 02/10/2019 11:16 AM Performed by: Lavina Hamman, CRNA Pre-anesthesia Checklist: Patient identified, Emergency Drugs available, Suction available, Patient being monitored and Timeout performed Patient Re-evaluated:Patient Re-evaluated prior to induction Oxygen Delivery Method: Circle system utilized Preoxygenation: Pre-oxygenation with 100% oxygen Induction Type: IV induction Ventilation: Mask ventilation without difficulty Laryngoscope Size: Mac and 3 Grade View: Grade I Tube type: Oral Tube size: 7.5 mm Number of attempts: 1 Airway Equipment and Method: Stylet Placement Confirmation: ETT inserted through vocal cords under direct vision,  positive ETCO2,  CO2 detector and breath sounds checked- equal and bilateral Secured at: 22 cm Tube secured with: Tape Dental Injury: Teeth and Oropharynx as per pre-operative assessment

## 2019-02-10 NOTE — Anesthesia Postprocedure Evaluation (Signed)
Anesthesia Post Note  Patient: Dillon Arias  Procedure(s) Performed: XI ROBOTIC ASSISTED LAPAROSCOPIC RADICAL PROSTATECTOMY LEVEL 2 (N/A ) LYMPHADENECTOMY, PELVIC (Bilateral )     Patient location during evaluation: PACU Anesthesia Type: General Level of consciousness: awake and alert Pain management: pain level controlled Vital Signs Assessment: post-procedure vital signs reviewed and stable Respiratory status: spontaneous breathing, nonlabored ventilation, respiratory function stable and patient connected to nasal cannula oxygen Cardiovascular status: blood pressure returned to baseline and stable Postop Assessment: no apparent nausea or vomiting Anesthetic complications: no    Last Vitals:  Vitals:   02/10/19 1545 02/10/19 1600  BP: (!) 162/91 (!) 166/87  Pulse: 76 76  Resp: 14 (!) 24  Temp:    SpO2: 97% 98%    Last Pain:  Vitals:   02/10/19 1600  TempSrc:   PainSc: [place]

## 2019-02-10 NOTE — Transfer of Care (Signed)
Immediate Anesthesia Transfer of Care Note  Patient: Dillon Arias  Procedure(s) Performed: XI ROBOTIC ASSISTED LAPAROSCOPIC RADICAL PROSTATECTOMY LEVEL 2 (N/A ) LYMPHADENECTOMY, PELVIC (Bilateral )  Patient Location: PACU  Anesthesia Type:General  Level of Consciousness: awake, alert , oriented and patient cooperative  Airway & Oxygen Therapy: Patient Spontanous Breathing and Patient connected to face mask oxygen  Post-op Assessment: Report given to RN, Post -op Vital signs reviewed and stable and Patient moving all extremities  Post vital signs: Reviewed and stable  Last Vitals:  Vitals Value Taken Time  BP 161/74 02/10/19 1435  Temp    Pulse 80 02/10/19 1437  Resp 25 02/10/19 1437  SpO2 100 % 02/10/19 1437  Vitals shown include unvalidated device data.  Last Pain:  Vitals:   02/10/19 0947  TempSrc:   PainSc: 0-No pain         Complications: No apparent anesthesia complications

## 2019-02-10 NOTE — Progress Notes (Signed)
Patient ID: Dillon Arias, adult   DOB: 1951-02-01, 68 y.o.   MRN: [id number]  Post-op note  Subjective: Complains of intense bladder spasm.  Objective: Vital signs in last 24 hours: Temp:  [98.3 F (36.8 C)] 98.3 F (36.8 C) (09/10 1435) Pulse Rate:  [71-80] 76 (09/10 1600) Resp:  [7-24] 24 (09/10 1600) BP: (132-166)/(58-91) 166/87 (09/10 1600) SpO2:  [95 %-100 %] 98 % (09/10 1600) Weight:  [102.7 kg] 102.7 kg (09/10 0947)  Intake/Output from previous day: No intake/output data recorded. Intake/Output this shift: Total I/O In: 2600 [I.V.:1500; IV Piggyback:1100] Out: 250 [Blood:250]  Physical Exam:  General: Alert and oriented. Abdomen: Soft, Nondistended. Incisions: Clean and dry. GU: Urine clear.  Lab Results: Recent Labs    02/10/19 1457  HGB 14.4  HCT 45.2    Assessment/Plan: POD#0   1) Continue to monitor, ambulate, IS  2) B &O suppository for bladder spasm   Pryor Curia. MD   LOS: 0 days   Dutch Gray 02/10/2019, 4:16 PM

## 2019-02-10 NOTE — Anesthesia Preprocedure Evaluation (Signed)
Anesthesia Evaluation  Patient identified by MRN, date of birth, ID band Patient awake    Reviewed: Allergy & Precautions, NPO status , Patient's Chart, lab work & pertinent test results  History of Anesthesia Complications Negative for: history of anesthetic complications  Airway Mallampati: I  TM Distance: >3 FB Neck ROM: Full    Dental  (+) Teeth Intact, Caps,    Pulmonary neg pulmonary ROS,    breath sounds clear to auscultation       Cardiovascular hypertension, Pt. on medications (-) angina(-) Past MI and (-) CHF  Rhythm:Regular     Neuro/Psych negative psych ROS   GI/Hepatic Neg liver ROS, GERD  Medicated and Controlled,  Endo/Other  negative endocrine ROS  Renal/GU PROSTATE CANCER     Musculoskeletal negative musculoskeletal ROS (+)   Abdominal   Peds  Hematology negative hematology ROS (+)   Anesthesia Other Findings   Reproductive/Obstetrics                             Anesthesia Physical Anesthesia Plan  ASA: II  Anesthesia Plan: General   Post-op Pain Management:    Induction: Intravenous  PONV Risk Score and Plan: 2 and Ondansetron and Dexamethasone  Airway Management Planned: Oral ETT  Additional Equipment: None  Intra-op Plan:   Post-operative Plan: Extubation in OR  Informed Consent: I have reviewed the patients History and Physical, chart, labs and discussed the procedure including the risks, benefits and alternatives for the proposed anesthesia with the patient or authorized representative who has indicated his/her understanding and acceptance.     Dental advisory given  Plan Discussed with: CRNA and Surgeon  Anesthesia Plan Comments:         Anesthesia Quick Evaluation

## 2019-02-10 NOTE — Op Note (Signed)
Preoperative diagnosis: Clinically localized adenocarcinoma of the prostate (clinical stage T2a N0 M0)  Postoperative diagnosis: Clinically localized adenocarcinoma of the prostate (clinical stage T2a N0 M0)  Procedure:  1. Robotic assisted laparoscopic radical prostatectomy (right nerve sparing) 2. Bilateral robotic assisted laparoscopic pelvic lymphadenectomy  Surgeon: Pryor Curia. M.D.  Assistant(s): Debbrah Alar, PA-C  An assistant was required for this surgical procedure.  The duties of the assistant included but were not limited to suctioning, passing suture, camera manipulation, retraction. This procedure would not be able to be performed without an Environmental consultant.   Resident: Dr. Kerrie Pleasure  Anesthesia: General  Complications: None  EBL: 250 mL  IVF:  1300 mL crystalloid  Specimens: 1. Prostate and seminal vesicles 2. Right pelvic lymph nodes 3. Left pelvic lymph nodes  Disposition of specimens: Pathology  Drains: 1. 20 Fr coude catheter 2. # 19 Blake pelvic drain  Indication: Dillon Arias is a 68 y.o. patient with clinically localized prostate cancer.  After a thorough review of the management options for treatment of prostate cancer, he elected to proceed with surgical therapy and the above procedure(s).  We have discussed the potential benefits and risks of the procedure, side effects of the proposed treatment, the likelihood of the patient achieving the goals of the procedure, and any potential problems that might occur during the procedure or recuperation. Informed consent has been obtained.  Description of procedure:  The patient was taken to the operating room and a general anesthetic was administered. He was given preoperative antibiotics, placed in the dorsal lithotomy position, and prepped and draped in the usual sterile fashion. Next a preoperative timeout was performed. A urethral catheter was placed into the bladder and a site was selected  near the umbilicus for placement of the camera port. This was placed using a standard open Hassan technique which allowed entry into the peritoneal cavity under direct vision and without difficulty. An 8 mm port was placed and a pneumoperitoneum established. The camera was then used to inspect the abdomen and there was no evidence of any intra-abdominal injuries or other abnormalities. The remaining abdominal ports were then placed. 8 mm robotic ports were placed in the right lower quadrant, left lower quadrant, and far left lateral abdominal wall. A 5 mm port was placed in the right upper quadrant and a 12 mm port was placed in the right lateral abdominal wall for laparoscopic assistance. All ports were placed under direct vision without difficulty. The surgical cart was then docked.   Utilizing the cautery scissors, the bladder was reflected posteriorly allowing entry into the space of Retzius and identification of the endopelvic fascia and prostate. The periprostatic fat was then removed from the prostate allowing full exposure of the endopelvic fascia. The endopelvic fascia was then incised from the apex back to the base of the prostate bilaterally and the underlying levator muscle fibers were swept laterally off the prostate thereby isolating the dorsal venous complex. The dorsal vein was then stapled and divided with a 45 mm Flex Echelon stapler. Attention then turned to the bladder neck which was divided anteriorly thereby allowing entry into the bladder and exposure of the urethral catheter. The catheter balloon was deflated and the catheter was brought into the operative field and used to retract the prostate anteriorly. The posterior bladder neck was then examined and was divided allowing further dissection between the bladder and prostate posteriorly until the vasa deferentia and seminal vessels were identified. The vasa deferentia were isolated, divided,  and lifted anteriorly. The seminal vesicles were  dissected down to their tips with care to control the seminal vascular arterial blood supply. These structures were then lifted anteriorly and the space between Denonvillier's fascia and the anterior rectum was developed with a combination of sharp and blunt dissection. This isolated the vascular pedicles of the prostate.  The lateral prostatic fascia on the right side of the prostate was then sharply incised allowing release of the neurovascular bundle. The vascular pedicle of the prostate on the right side was then ligated with Weck clips between the prostate and neurovascular bundle and divided with sharp cold scissor dissection resulting in neurovascular bundle preservation. On the left side, a wide non nerve sparing dissection was performed with Weck clips used to ligate the vascular pedicle of the prostate. The neurovascular bundle on the right side was then separated off the apex of the prostate and urethra.  The urethra was then sharply transected allowing the prostate specimen to be disarticulated. The pelvis was copiously irrigated and hemostasis was ensured. There was no evidence for rectal injury.  Attention then turned to the right pelvic sidewall. The fibrofatty tissue between the external iliac vein, confluence of the iliac vessels, hypogastric artery, and Cooper's ligament was dissected free from the pelvic sidewall with care to preserve the obturator nerve. Weck clips were used for lymphostasis and hemostasis. An identical procedure was performed on the contralateral side and the lymphatic packets were removed for permanent pathologic analysis.  Attention then turned to the urethral anastomosis. A 2-0 Vicryl slip knot was placed between Denonvillier's fascia, the posterior bladder neck, and the posterior urethra to reapproximate these structures. A double-armed 3-0 Monocryl suture was then used to perform a 360 running tension-free anastomosis between the bladder neck and urethra. A new  urethral catheter was then placed into the bladder and irrigated. There were no blood clots within the bladder and the anastomosis appeared to be watertight. A #19 Blake drain was then brought through the left lateral 8 mm port site and positioned appropriately within the pelvis. It was secured to the skin with a nylon suture. The surgical cart was then undocked. The right lateral 12 mm port site was closed at the fascial level with a 0 Vicryl suture placed laparoscopically. All remaining ports were then removed under direct vision. The prostate specimen was removed intact within the Endopouch retrieval bag via the periumbilical camera port site. This fascial opening was closed with two running 0 Vicryl sutures. 0.25% Marcaine was then injected into all port sites and all incisions were reapproximated at the skin level with 4-0 Monocryl subcuticular sutures. Dermabond was applied. The patient appeared to tolerate the procedure well and without complications. The patient was able to be extubated and transferred to the recovery unit in satisfactory condition.   Pryor Curia MD

## 2019-02-11 ENCOUNTER — Encounter (HOSPITAL_COMMUNITY): Payer: Self-pay | Admitting: Urology

## 2019-02-11 DIAGNOSIS — E785 Hyperlipidemia, unspecified: Secondary | ICD-10-CM | POA: Diagnosis not present

## 2019-02-11 DIAGNOSIS — K219 Gastro-esophageal reflux disease without esophagitis: Secondary | ICD-10-CM | POA: Diagnosis not present

## 2019-02-11 DIAGNOSIS — Z7982 Long term (current) use of aspirin: Secondary | ICD-10-CM | POA: Diagnosis not present

## 2019-02-11 DIAGNOSIS — Z79899 Other long term (current) drug therapy: Secondary | ICD-10-CM | POA: Diagnosis not present

## 2019-02-11 DIAGNOSIS — C61 Malignant neoplasm of prostate: Secondary | ICD-10-CM | POA: Diagnosis not present

## 2019-02-11 DIAGNOSIS — I10 Essential (primary) hypertension: Secondary | ICD-10-CM | POA: Diagnosis not present

## 2019-02-11 LAB — HEMOGLOBIN AND HEMATOCRIT, BLOOD
HCT: 41.5 % (ref 39.0–52.0)
Hemoglobin: 13.4 g/dL (ref 13.0–17.0)

## 2019-02-11 MED ORDER — BISACODYL 10 MG RE SUPP
10.0000 mg | Freq: Once | RECTAL | Status: AC
  Administered 2019-02-11: 10 mg via RECTAL
  Filled 2019-02-11: qty 1

## 2019-02-11 MED ORDER — TRAMADOL HCL 50 MG PO TABS
50.0000 mg | ORAL_TABLET | Freq: Four times a day (QID) | ORAL | Status: DC | PRN
  Administered 2019-02-11: 50 mg via ORAL
  Filled 2019-02-11: qty 1

## 2019-02-11 NOTE — Progress Notes (Signed)
1 Day Post-Op Subjective: The patient is doing well.  No nausea or vomiting. Pain is adequately controlled.  Objective: Vital signs in last 24 hours: Temp:  [97.9 F (36.6 C)-98.9 F (37.2 C)] 98.7 F (37.1 C) (09/11 0511) Pulse Rate:  [68-80] 68 (09/11 0511) Resp:  [7-24] 18 (09/11 0511) BP: (125-169)/(58-94) 135/80 (09/11 0511) SpO2:  [95 %-100 %] 98 % (09/11 0511) Weight:  [102.1 kg-102.7 kg] 102.1 kg (09/10 1809)  Intake/Output from previous day: 09/10 0701 - 09/11 0700 In: 4738 [P.O.:240; I.V.:3298; IV Piggyback:1200] Out: 2211 [Urine:1801; Drains:160; Blood:250] Intake/Output this shift: No intake/output data recorded.  Physical Exam:  General: Alert and oriented. CV: RRR Lungs: Clear bilaterally. GI: Soft, Nondistended. Incisions: Clean, dry, and intact Urine: Clear Extremities: Nontender, no erythema, no edema.  Lab Results: Recent Labs    02/10/19 1457 02/11/19 0441  HGB 14.4 13.4  HCT 45.2 41.5      Assessment/Plan: POD# 1 s/p robotic prostatectomy.  1) SL IVF 2) Ambulate, Incentive spirometry 3) Transition to oral pain medication 4) Dulcolax suppository 5) D/C pelvic drain 6) Plan for likely discharge later today   Pryor Curia. MD   LOS: 0 days   Dutch Gray 02/11/2019, 8:43 AM

## 2019-02-11 NOTE — Discharge Summary (Signed)
  Date of admission: 02/10/2019  Date of discharge: 02/11/2019  Admission diagnosis: Prostate Cancer  Discharge diagnosis: Prostate Cancer  History and Physical: For full details, please see admission history and physical. Briefly, Dillon Arias is a 68 y.o. gentleman with localized prostate cancer.  After discussing management/treatment options, he elected to proceed with surgical treatment.  Hospital Course: Dillon Arias was taken to the operating room on 02/10/2019 and underwent a robotic assisted laparoscopic radical prostatectomy. He tolerated this procedure well and without complications. Postoperatively, he was able to be transferred to a regular hospital room following recovery from anesthesia.  He was able to begin ambulating the night of surgery. He remained hemodynamically stable overnight.  He had excellent urine output with appropriately minimal output from his pelvic drain and his pelvic drain was removed on POD #1.  He was transitioned to oral pain medication, tolerated a clear liquid diet, and had met all discharge criteria and was able to be discharged home later on POD#1.  Laboratory values:  Recent Labs    02/10/19 1457 02/11/19 0441  HGB 14.4 13.4  HCT 45.2 41.5    Disposition: Home  Discharge instruction: He was instructed to be ambulatory but to refrain from heavy lifting, strenuous activity, or driving. He was instructed on urethral catheter care.  Discharge medications:   Allergies as of 02/11/2019      Reactions   Codeine Other (See Comments)   dysphoric   Epinephrine Other (See Comments)   tachycardia   Terbinafine And Related Nausea Only   Flu like symptoms      Medication List    STOP taking these medications   aspirin 81 MG tablet   methocarbamol 500 MG tablet Commonly known as: ROBAXIN   multivitamin with minerals Tabs tablet   sildenafil 20 MG tablet Commonly known as: REVATIO   Vitamin D 50 MCG (2000 UT) tablet     TAKE these  medications   atorvastatin 20 MG tablet Commonly known as: LIPITOR Take 20 mg by mouth daily.   famotidine 20 MG tablet Commonly known as: PEPCID Take 20 mg by mouth daily.   fexofenadine 180 MG tablet Commonly known as: ALLEGRA Take 180 mg by mouth daily.   fluticasone 50 MCG/ACT nasal spray Commonly known as: FLONASE Place 2 sprays into both nostrils daily.   pantoprazole 40 MG tablet Commonly known as: PROTONIX Take 40 mg by mouth every evening.   sulfamethoxazole-trimethoprim 800-160 MG tablet Commonly known as: BACTRIM DS Take 1 tablet by mouth 2 (two) times daily. Start the day prior to foley removal appointment   traMADol 50 MG tablet Commonly known as: Ultram Take 1-2 tablets (50-100 mg total) by mouth every 6 (six) hours as needed for moderate pain or severe pain.   valsartan-hydrochlorothiazide 160-12.5 MG tablet Commonly known as: DIOVAN-HCT Take 1 tablet by mouth daily.       Followup: He will followup in 1 week for catheter removal and to discuss his surgical pathology results.

## 2019-02-17 MED FILL — VALSARTAN-HCTZ 160-12.5 MG: 160-12.5 | 30 days supply | Qty: 30 | Fill #3

## 2019-03-07 DIAGNOSIS — R3915 Urgency of urination: Secondary | ICD-10-CM | POA: Diagnosis not present

## 2019-03-07 DIAGNOSIS — M6281 Muscle weakness (generalized): Secondary | ICD-10-CM | POA: Diagnosis not present

## 2019-03-07 DIAGNOSIS — N393 Stress incontinence (female) (male): Secondary | ICD-10-CM | POA: Diagnosis not present

## 2019-03-14 DIAGNOSIS — R351 Nocturia: Secondary | ICD-10-CM | POA: Diagnosis not present

## 2019-03-14 DIAGNOSIS — M6281 Muscle weakness (generalized): Secondary | ICD-10-CM | POA: Diagnosis not present

## 2019-03-14 DIAGNOSIS — N393 Stress incontinence (female) (male): Secondary | ICD-10-CM | POA: Diagnosis not present

## 2019-03-14 DIAGNOSIS — M62838 Other muscle spasm: Secondary | ICD-10-CM | POA: Diagnosis not present

## 2019-03-17 ENCOUNTER — Other Ambulatory Visit: Payer: Self-pay

## 2019-03-17 DIAGNOSIS — Z20828 Contact with and (suspected) exposure to other viral communicable diseases: Secondary | ICD-10-CM | POA: Diagnosis not present

## 2019-03-17 DIAGNOSIS — Z20822 Contact with and (suspected) exposure to covid-19: Secondary | ICD-10-CM

## 2019-03-19 LAB — NOVEL CORONAVIRUS, NAA: SARS-CoV-2, NAA: NOT DETECTED

## 2019-03-22 MED FILL — VALSARTAN-HCTZ 160-12.5 MG: 160-12.5 | 30 days supply | Qty: 30 | Fill #4

## 2019-03-28 ENCOUNTER — Other Ambulatory Visit: Payer: Self-pay | Admitting: Registered"

## 2019-03-28 DIAGNOSIS — Z20822 Contact with and (suspected) exposure to covid-19: Secondary | ICD-10-CM

## 2019-03-30 LAB — NOVEL CORONAVIRUS, NAA: SARS-CoV-2, NAA: NOT DETECTED

## 2019-04-25 MED FILL — FAMOTIDINE 20 MG TABLET: 20 | 90 days supply | Qty: 90 | Fill #0

## 2019-04-25 MED FILL — ATORVASTATIN 20 MG TABLET: 20 | 90 days supply | Qty: 90 | Fill #1

## 2019-04-25 MED FILL — PANTOPRAZOLE SOD DR 40 MG T: 40 | 84 days supply | Qty: 60 | Fill #0

## 2019-04-25 MED FILL — VALSARTAN-HCTZ 160-12.5 MG: 160-12.5 | 30 days supply | Qty: 30 | Fill #5

## 2019-05-04 DIAGNOSIS — R351 Nocturia: Secondary | ICD-10-CM | POA: Diagnosis not present

## 2019-05-04 DIAGNOSIS — M6281 Muscle weakness (generalized): Secondary | ICD-10-CM | POA: Diagnosis not present

## 2019-05-04 DIAGNOSIS — N393 Stress incontinence (female) (male): Secondary | ICD-10-CM | POA: Diagnosis not present

## 2019-05-04 DIAGNOSIS — R3915 Urgency of urination: Secondary | ICD-10-CM | POA: Diagnosis not present

## 2019-05-11 DIAGNOSIS — C61 Malignant neoplasm of prostate: Secondary | ICD-10-CM | POA: Diagnosis not present

## 2019-05-18 DIAGNOSIS — N393 Stress incontinence (female) (male): Secondary | ICD-10-CM | POA: Diagnosis not present

## 2019-05-18 DIAGNOSIS — N5201 Erectile dysfunction due to arterial insufficiency: Secondary | ICD-10-CM | POA: Diagnosis not present

## 2019-05-18 DIAGNOSIS — C61 Malignant neoplasm of prostate: Secondary | ICD-10-CM | POA: Diagnosis not present

## 2019-05-18 MED FILL — TADALAFIL 5 MG TABS: 5 | 30 days supply | Qty: 30 | Fill #0

## 2019-06-06 DIAGNOSIS — Z0001 Encounter for general adult medical examination with abnormal findings: Secondary | ICD-10-CM | POA: Diagnosis not present

## 2019-06-06 DIAGNOSIS — I10 Essential (primary) hypertension: Secondary | ICD-10-CM | POA: Diagnosis not present

## 2019-06-06 DIAGNOSIS — C61 Malignant neoplasm of prostate: Secondary | ICD-10-CM | POA: Diagnosis not present

## 2019-06-06 DIAGNOSIS — Z1389 Encounter for screening for other disorder: Secondary | ICD-10-CM | POA: Diagnosis not present

## 2019-06-06 MED FILL — VALSARTAN-HCTZ 160-12.5 MG: 160-12.5 | 30 days supply | Qty: 30 | Fill #6

## 2019-06-17 DIAGNOSIS — H43813 Vitreous degeneration, bilateral: Secondary | ICD-10-CM | POA: Diagnosis not present

## 2019-06-27 MED FILL — TADALAFIL 5 MG TABS: 5 | 30 days supply | Qty: 30 | Fill #1

## 2019-06-27 MED FILL — VALSARTAN-HCTZ 160-12.5 MG: 160-12.5 | 30 days supply | Qty: 30 | Fill #7

## 2019-07-07 DIAGNOSIS — H43813 Vitreous degeneration, bilateral: Secondary | ICD-10-CM | POA: Diagnosis not present

## 2019-07-25 MED FILL — ATORVASTATIN 20 MG TABLET: 20 | 90 days supply | Qty: 90 | Fill #0

## 2019-07-25 MED FILL — TADALAFIL 5 MG TABS: 5 | 30 days supply | Qty: 30 | Fill #2

## 2019-07-25 MED FILL — FAMOTIDINE 20 MG TABS: 20 | 90 days supply | Qty: 90 | Fill #1

## 2019-07-29 MED FILL — VALSARTAN-HCTZ 160-12.5 MG: 160-12.5 | 90 days supply | Qty: 90 | Fill #0

## 2019-09-12 MED FILL — PANTOPRAZOLE SOD DR 40 MG T: 40 | 84 days supply | Qty: 60 | Fill #1

## 2019-09-27 MED FILL — SILDENAFIL CITRATE 20 MG TA: 20 | 10 days supply | Qty: 30 | Fill #0

## 2019-10-27 MED FILL — FAMOTIDINE 20 MG TABS: 20 | 90 days supply | Qty: 90 | Fill #2

## 2019-10-27 MED FILL — ATORVASTATIN 20 MG TABLET: 20 | 90 days supply | Qty: 90 | Fill #1

## 2019-10-27 MED FILL — VALSARTAN-HCTZ 160-12.5 MG: 160-12.5 | 90 days supply | Qty: 90 | Fill #1

## 2019-11-18 DIAGNOSIS — C61 Malignant neoplasm of prostate: Secondary | ICD-10-CM | POA: Diagnosis not present

## 2019-11-25 DIAGNOSIS — N5201 Erectile dysfunction due to arterial insufficiency: Secondary | ICD-10-CM | POA: Diagnosis not present

## 2019-11-25 DIAGNOSIS — N393 Stress incontinence (female) (male): Secondary | ICD-10-CM | POA: Diagnosis not present

## 2019-11-25 DIAGNOSIS — C61 Malignant neoplasm of prostate: Secondary | ICD-10-CM | POA: Diagnosis not present

## 2019-11-28 MED FILL — TADALAFIL 5 MG TABS: 5 | 30 days supply | Qty: 30 | Fill #3

## 2020-01-05 MED FILL — TADALAFIL 5 MG TABS: 5 | 30 days supply | Qty: 30 | Fill #4

## 2020-01-23 MED FILL — FAMOTIDINE 20 MG TABS: 20 | 90 days supply | Qty: 90 | Fill #3

## 2020-01-23 MED FILL — VALSARTAN-HCTZ 160-12.5 MG: 160-12.5 | 90 days supply | Qty: 90 | Fill #2

## 2020-01-23 MED FILL — ATORVASTATIN 20 MG TABLET: 20 | 90 days supply | Qty: 90 | Fill #2

## 2020-01-24 ENCOUNTER — Other Ambulatory Visit (HOSPITAL_COMMUNITY): Payer: Self-pay | Admitting: Internal Medicine

## 2020-01-24 MED FILL — PANTOPRAZOLE SOD DR 40 MG T: 40 | 84 days supply | Qty: 60 | Fill #0

## 2020-02-07 MED FILL — TADALAFIL 5 MG TABS: 5 | 30 days supply | Qty: 30 | Fill #5

## 2020-02-16 DIAGNOSIS — M7071 Other bursitis of hip, right hip: Secondary | ICD-10-CM | POA: Diagnosis not present

## 2020-02-16 DIAGNOSIS — L237 Allergic contact dermatitis due to plants, except food: Secondary | ICD-10-CM | POA: Diagnosis not present

## 2020-02-16 DIAGNOSIS — M7072 Other bursitis of hip, left hip: Secondary | ICD-10-CM | POA: Diagnosis not present

## 2020-02-16 MED FILL — predniSONE 10 MG TABS: 10 | 12 days supply | Qty: 28 | Fill #0

## 2020-03-05 MED FILL — TADALAFIL 5 MG TABS: 5 | 30 days supply | Qty: 30 | Fill #6

## 2020-03-07 DIAGNOSIS — H5213 Myopia, bilateral: Secondary | ICD-10-CM | POA: Diagnosis not present

## 2020-04-16 ENCOUNTER — Other Ambulatory Visit (HOSPITAL_COMMUNITY): Payer: Self-pay | Admitting: Internal Medicine

## 2020-04-16 MED FILL — ATORVASTATIN 20 MG TABLET: 20 | 90 days supply | Qty: 90 | Fill #3

## 2020-04-16 MED FILL — TADALAFIL 5 MG TABS: 5 | 30 days supply | Qty: 30 | Fill #7

## 2020-04-17 DIAGNOSIS — M25519 Pain in unspecified shoulder: Secondary | ICD-10-CM | POA: Diagnosis not present

## 2020-04-17 DIAGNOSIS — M25511 Pain in right shoulder: Secondary | ICD-10-CM | POA: Diagnosis not present

## 2020-04-17 MED FILL — VALSARTAN-HCTZ 160-12.5 MG: 160-12.5 | 90 days supply | Qty: 90 | Fill #0

## 2020-04-25 ENCOUNTER — Other Ambulatory Visit (HOSPITAL_COMMUNITY): Payer: Self-pay | Admitting: Internal Medicine

## 2020-04-25 MED FILL — FAMOTIDINE 20 MG TABS: 20 | 90 days supply | Qty: 90 | Fill #0

## 2020-05-21 DIAGNOSIS — R0789 Other chest pain: Secondary | ICD-10-CM | POA: Diagnosis not present

## 2020-05-21 MED FILL — PANTOPRAZOLE SOD DR 40 MG T: 40 | 84 days supply | Qty: 60 | Fill #1

## 2020-05-31 ENCOUNTER — Other Ambulatory Visit (HOSPITAL_COMMUNITY): Payer: Self-pay | Admitting: Urology

## 2020-05-31 MED FILL — TADALAFIL 5 MG TABS: 5 | 30 days supply | Qty: 30 | Fill #0

## 2020-06-13 DIAGNOSIS — R0981 Nasal congestion: Secondary | ICD-10-CM | POA: Diagnosis not present

## 2020-06-14 DIAGNOSIS — Z1389 Encounter for screening for other disorder: Secondary | ICD-10-CM | POA: Diagnosis not present

## 2020-06-14 DIAGNOSIS — I10 Essential (primary) hypertension: Secondary | ICD-10-CM | POA: Diagnosis not present

## 2020-06-14 DIAGNOSIS — C61 Malignant neoplasm of prostate: Secondary | ICD-10-CM | POA: Diagnosis not present

## 2020-06-14 DIAGNOSIS — Z0001 Encounter for general adult medical examination with abnormal findings: Secondary | ICD-10-CM | POA: Diagnosis not present

## 2020-06-27 DIAGNOSIS — C61 Malignant neoplasm of prostate: Secondary | ICD-10-CM | POA: Diagnosis not present

## 2020-06-27 DIAGNOSIS — N5201 Erectile dysfunction due to arterial insufficiency: Secondary | ICD-10-CM | POA: Diagnosis not present

## 2020-07-06 MED FILL — TADALAFIL 5 MG TABS: 5 | 30 days supply | Qty: 30 | Fill #1

## 2020-07-31 ENCOUNTER — Other Ambulatory Visit (HOSPITAL_COMMUNITY): Payer: Self-pay | Admitting: Internal Medicine

## 2020-07-31 MED FILL — ATORVASTATIN CALCIUM 20 MG: 20 | 90 days supply | Qty: 90 | Fill #0

## 2020-07-31 MED FILL — FAMOTIDINE 20 MG TABS: 20 | 90 days supply | Qty: 90 | Fill #1

## 2020-07-31 MED FILL — VALSARTAN-HCTZ 160-12.5 MG: 160-12.5 | 90 days supply | Qty: 90 | Fill #1

## 2020-08-24 MED FILL — TADALAFIL 5 MG TABS: 5 | 30 days supply | Qty: 30 | Fill #2

## 2020-10-02 ENCOUNTER — Other Ambulatory Visit (HOSPITAL_COMMUNITY): Payer: Self-pay

## 2020-10-02 MED FILL — Tadalafil Tab 5 MG: ORAL | 30 days supply | Qty: 30 | Fill #0 | Status: AC

## 2020-10-03 ENCOUNTER — Other Ambulatory Visit (HOSPITAL_COMMUNITY): Payer: Self-pay

## 2020-10-04 DIAGNOSIS — L723 Sebaceous cyst: Secondary | ICD-10-CM | POA: Diagnosis not present

## 2020-10-11 ENCOUNTER — Other Ambulatory Visit (HOSPITAL_COMMUNITY): Payer: Self-pay

## 2020-10-11 DIAGNOSIS — L723 Sebaceous cyst: Secondary | ICD-10-CM | POA: Diagnosis not present

## 2020-10-11 MED ORDER — DOXYCYCLINE HYCLATE 100 MG PO CAPS
100.0000 mg | ORAL_CAPSULE | Freq: Two times a day (BID) | ORAL | 0 refills | Status: AC
  Filled 2020-10-11: qty 14, 7d supply, fill #0

## 2020-11-01 ENCOUNTER — Other Ambulatory Visit (HOSPITAL_COMMUNITY): Payer: Self-pay

## 2020-11-01 MED FILL — Atorvastatin Calcium Tab 20 MG (Base Equivalent): ORAL | 90 days supply | Qty: 90 | Fill #0 | Status: AC

## 2020-11-01 MED FILL — Pantoprazole Sodium EC Tab 40 MG (Base Equiv): ORAL | 84 days supply | Qty: 60 | Fill #0 | Status: AC

## 2020-11-01 MED FILL — Valsartan-Hydrochlorothiazide Tab 160-12.5 MG: ORAL | 90 days supply | Qty: 90 | Fill #0 | Status: AC

## 2020-11-05 ENCOUNTER — Other Ambulatory Visit (HOSPITAL_COMMUNITY): Payer: Self-pay

## 2020-11-05 MED FILL — Tadalafil Tab 5 MG: ORAL | 30 days supply | Qty: 30 | Fill #1 | Status: AC

## 2020-11-05 MED FILL — Famotidine Tab 20 MG: ORAL | 90 days supply | Qty: 90 | Fill #0 | Status: AC

## 2020-12-12 ENCOUNTER — Other Ambulatory Visit (HOSPITAL_COMMUNITY): Payer: Self-pay

## 2020-12-12 MED FILL — Tadalafil Tab 5 MG: ORAL | 30 days supply | Qty: 30 | Fill #2 | Status: AC

## 2021-01-07 ENCOUNTER — Other Ambulatory Visit (HOSPITAL_COMMUNITY): Payer: Self-pay

## 2021-01-07 MED FILL — Tadalafil Tab 5 MG: ORAL | 30 days supply | Qty: 30 | Fill #3 | Status: AC

## 2021-01-16 DIAGNOSIS — C61 Malignant neoplasm of prostate: Secondary | ICD-10-CM | POA: Diagnosis not present

## 2021-01-23 ENCOUNTER — Other Ambulatory Visit (HOSPITAL_COMMUNITY): Payer: Self-pay

## 2021-01-23 DIAGNOSIS — N5201 Erectile dysfunction due to arterial insufficiency: Secondary | ICD-10-CM | POA: Diagnosis not present

## 2021-01-23 DIAGNOSIS — C61 Malignant neoplasm of prostate: Secondary | ICD-10-CM | POA: Diagnosis not present

## 2021-01-23 MED ORDER — TADALAFIL 5 MG PO TABS
5.0000 mg | ORAL_TABLET | Freq: Every day | ORAL | 3 refills | Status: DC
  Filled 2021-01-23: qty 90, 90d supply, fill #0

## 2021-01-31 ENCOUNTER — Other Ambulatory Visit (HOSPITAL_COMMUNITY): Payer: Self-pay

## 2021-01-31 MED FILL — Famotidine Tab 20 MG: ORAL | 90 days supply | Qty: 90 | Fill #1 | Status: AC

## 2021-01-31 MED FILL — Valsartan-Hydrochlorothiazide Tab 160-12.5 MG: ORAL | 90 days supply | Qty: 90 | Fill #1 | Status: AC

## 2021-01-31 MED FILL — Atorvastatin Calcium Tab 20 MG (Base Equivalent): ORAL | 90 days supply | Qty: 90 | Fill #1 | Status: AC

## 2021-02-10 MED FILL — Tadalafil Tab 5 MG: ORAL | 30 days supply | Qty: 30 | Fill #4 | Status: AC

## 2021-02-11 ENCOUNTER — Other Ambulatory Visit (HOSPITAL_COMMUNITY): Payer: Self-pay

## 2021-02-14 ENCOUNTER — Other Ambulatory Visit (HOSPITAL_COMMUNITY): Payer: Self-pay

## 2021-03-15 ENCOUNTER — Other Ambulatory Visit (HOSPITAL_COMMUNITY): Payer: Self-pay

## 2021-03-15 MED ORDER — PANTOPRAZOLE SODIUM 40 MG PO TBEC
40.0000 mg | DELAYED_RELEASE_TABLET | Freq: Every day | ORAL | 3 refills | Status: DC
  Filled 2021-03-15: qty 60, 84d supply, fill #0

## 2021-03-15 MED FILL — Tadalafil Tab 5 MG: ORAL | 30 days supply | Qty: 30 | Fill #5 | Status: AC

## 2021-03-18 ENCOUNTER — Other Ambulatory Visit (HOSPITAL_COMMUNITY): Payer: Self-pay

## 2021-04-30 DIAGNOSIS — C61 Malignant neoplasm of prostate: Secondary | ICD-10-CM | POA: Diagnosis not present

## 2021-05-01 ENCOUNTER — Other Ambulatory Visit (HOSPITAL_COMMUNITY): Payer: Self-pay

## 2021-05-01 MED FILL — Tadalafil Tab 5 MG: ORAL | 30 days supply | Qty: 30 | Fill #6 | Status: AC

## 2021-05-01 MED FILL — Atorvastatin Calcium Tab 20 MG (Base Equivalent): ORAL | 90 days supply | Qty: 90 | Fill #2 | Status: AC

## 2021-05-02 ENCOUNTER — Other Ambulatory Visit (HOSPITAL_COMMUNITY): Payer: Self-pay

## 2021-05-02 MED ORDER — FAMOTIDINE 20 MG PO TABS
20.0000 mg | ORAL_TABLET | Freq: Every morning | ORAL | 4 refills | Status: DC
  Filled 2021-05-02: qty 90, 90d supply, fill #0

## 2021-05-02 MED ORDER — VALSARTAN-HYDROCHLOROTHIAZIDE 160-12.5 MG PO TABS
1.0000 | ORAL_TABLET | Freq: Every day | ORAL | 4 refills | Status: DC
  Filled 2021-05-02: qty 90, 90d supply, fill #0

## 2021-05-03 DIAGNOSIS — I10 Essential (primary) hypertension: Secondary | ICD-10-CM | POA: Diagnosis not present

## 2021-05-03 DIAGNOSIS — F439 Reaction to severe stress, unspecified: Secondary | ICD-10-CM | POA: Diagnosis not present

## 2021-05-10 ENCOUNTER — Other Ambulatory Visit (HOSPITAL_COMMUNITY): Payer: Self-pay

## 2021-05-10 DIAGNOSIS — M5432 Sciatica, left side: Secondary | ICD-10-CM | POA: Diagnosis not present

## 2021-05-10 DIAGNOSIS — F439 Reaction to severe stress, unspecified: Secondary | ICD-10-CM | POA: Diagnosis not present

## 2021-05-10 DIAGNOSIS — I10 Essential (primary) hypertension: Secondary | ICD-10-CM | POA: Diagnosis not present

## 2021-05-10 MED ORDER — PREDNISONE 10 MG PO TABS
ORAL_TABLET | ORAL | 1 refills | Status: DC
  Filled 2021-05-10: qty 30, 12d supply, fill #0

## 2021-05-15 ENCOUNTER — Other Ambulatory Visit (HOSPITAL_COMMUNITY): Payer: Self-pay | Admitting: Urology

## 2021-05-15 DIAGNOSIS — C61 Malignant neoplasm of prostate: Secondary | ICD-10-CM

## 2021-05-22 NOTE — Progress Notes (Signed)
GU Location of Tumor / Histology: Prostate Ca  If Prostate Cancer, Gleason Score is (4 + 3) and PSA is (0.19 as of 04/30/2021, 4.06 as of 06/30/2018)  Biopsies  Dr. Diona Fanti          Past/Anticipated interventions by urology, if any:   Past/Anticipated interventions by medical oncology, if any:   Weight changes, if any: Stable  IPSS:  0 SHIM:  5  Bowel/Bladder complaints, if any:  No issues  Nausea/Vomiting, if any: No  Pain issues, if any:  0/10  SAFETY ISSUES: Prior radiation? No Pacemaker/ICD? No Possible current pregnancy?  Male Is the patient on methotrexate?  No  Current Complaints / other details:  Need more information on treatment options.

## 2021-05-28 ENCOUNTER — Other Ambulatory Visit: Payer: Self-pay

## 2021-05-28 ENCOUNTER — Encounter (HOSPITAL_COMMUNITY)
Admission: RE | Admit: 2021-05-28 | Discharge: 2021-05-28 | Disposition: A | Discharge status: Home / Self Care | Payer: Medicare Other | Source: Ambulatory Visit | Attending: Urology | Admitting: Urology

## 2021-05-28 DIAGNOSIS — C61 Malignant neoplasm of prostate: Secondary | ICD-10-CM | POA: Insufficient documentation

## 2021-05-28 DIAGNOSIS — K5730 Diverticulosis of large intestine without perforation or abscess without bleeding: Secondary | ICD-10-CM | POA: Diagnosis not present

## 2021-05-28 DIAGNOSIS — K439 Ventral hernia without obstruction or gangrene: Secondary | ICD-10-CM | POA: Diagnosis not present

## 2021-05-28 DIAGNOSIS — I2510 Atherosclerotic heart disease of native coronary artery without angina pectoris: Secondary | ICD-10-CM | POA: Diagnosis not present

## 2021-05-28 IMAGING — CT NM PET TUM IMG SKULL BASE T - THIGH
7 series · 25 of 25 positions shown · non-contrast
Comparison: CT on [DATE]

CLINICAL DATA: Prostate carcinoma with biochemical recurrence.
Prior prostatectomy.

EXAM:
NUCLEAR MEDICINE PET SKULL BASE TO THIGH
TECHNIQUE: 10.0 mCi F18 Piflufolastat (Pylarify) was injected intravenously.
Full-ring PET imaging was performed from the skull base to thigh
after the radiotracer. CT data was obtained and used for attenuation
correction and anatomic localization.

[Series 3: pet sk_thigh ac · axial · 5.0mm · 4.07mm/px · z∈[-1371,-395]mm · 6 of 245 slices shown]
[im 1/245]
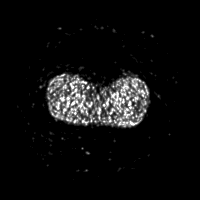
[im 49/245]
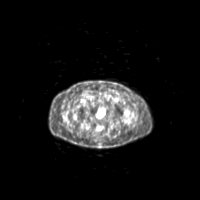
[im 98/245]
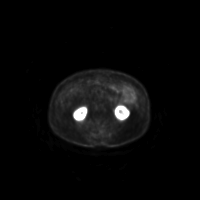
[im 147/245]
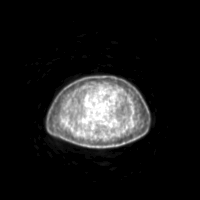
[im 196/245]
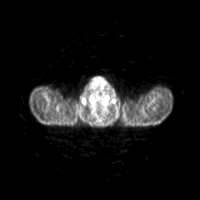
[im 245/245]
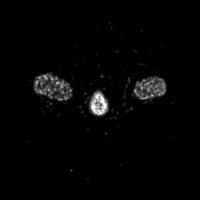

[Series 4: ct sk_thigh 5.0 bf37 · axial · 5.0mm · 0.98mm/px · z∈[-1371,-395]mm · 5 of 245 slices shown]
[im 1/245  brain]
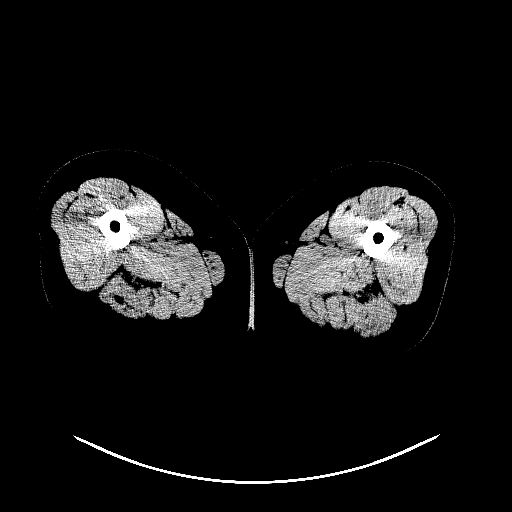
[im 62/245]
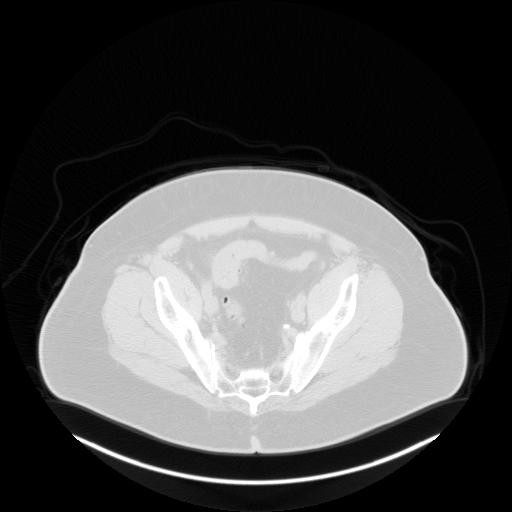
[im 123/245]
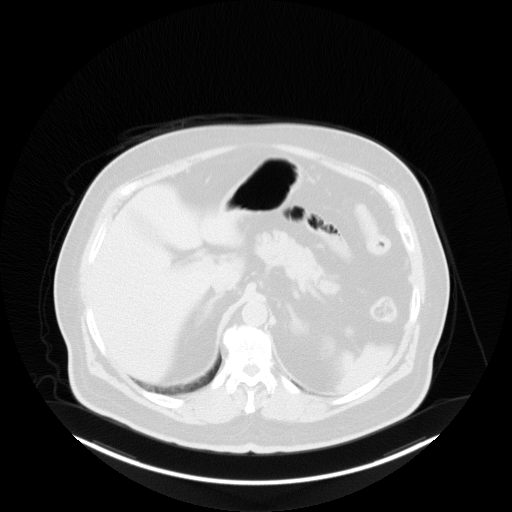
[im 184/245]
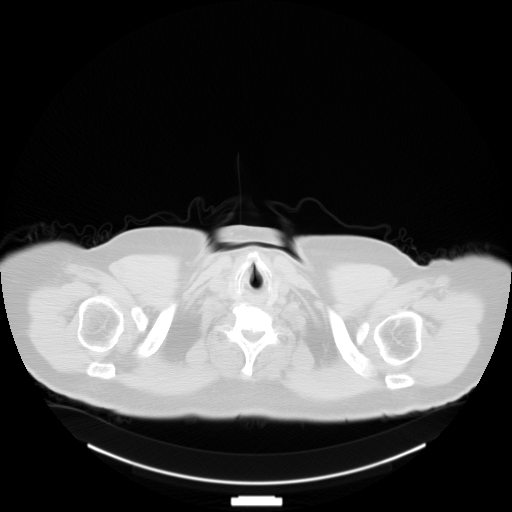
[im 245/245]
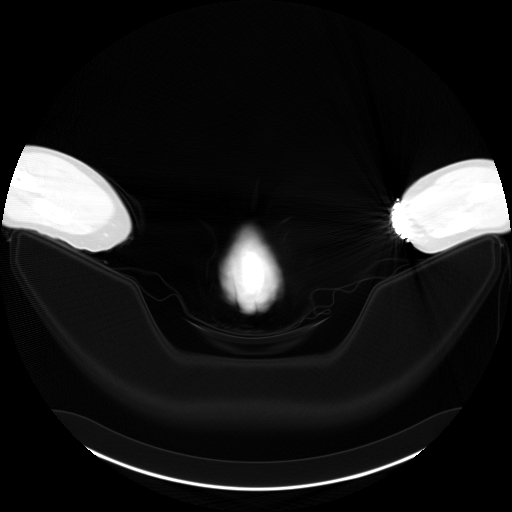

[Series 5: pet sk_thigh nac · axial · 5.0mm · 4.07mm/px · z∈[-1371,-395]mm · 5 of 245 slices shown]
[im 1/245]
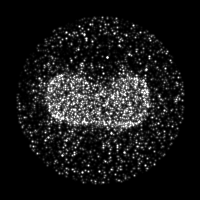
[im 62/245]
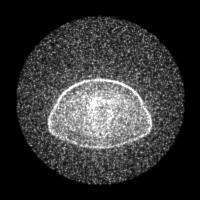
[im 123/245]
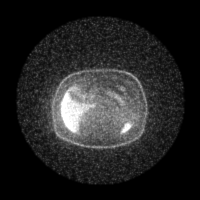
[im 184/245]
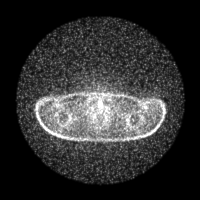
[im 245/245]
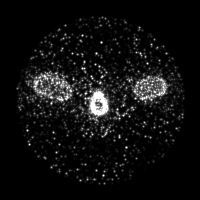

[Series 8: ct sk_thigh 5.0 br59 lung_bone · axial · 5.0mm · 0.82mm/px · z∈[-931,-643]mm · 2 of 73 slices shown]
[im 1/73]
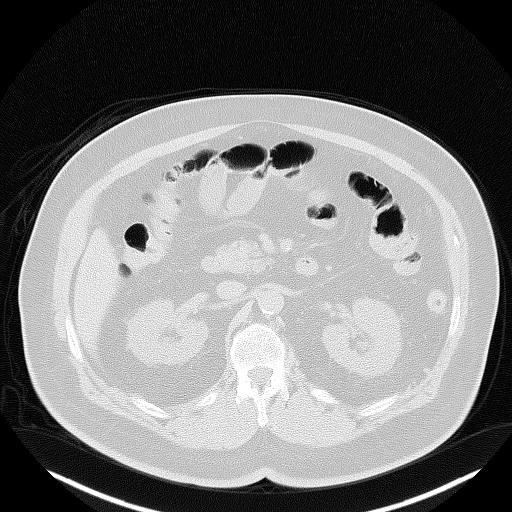
[im 73/73]
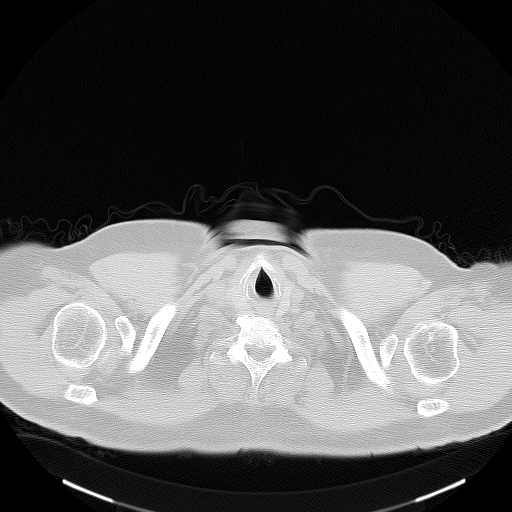

[Series 603: <mip collection> · coronal · 2.02mm/px · 1 of 32 slices shown]
[im 1/32]
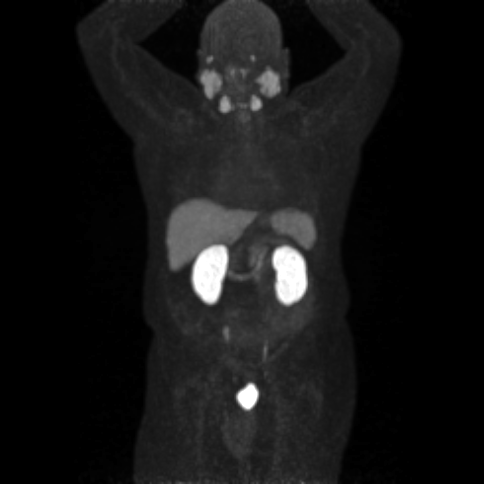

[Series 604: range-ct sk_thigh 5.0 bf37-tra-<alpha range> · 5 of 239 slices shown]
[im 1/239]
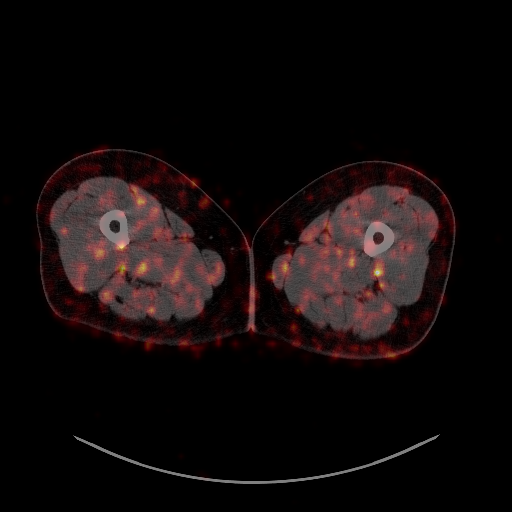
[im 60/239]
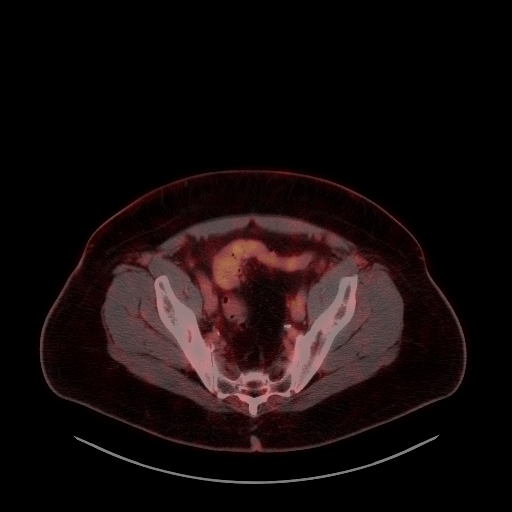
[im 120/239]
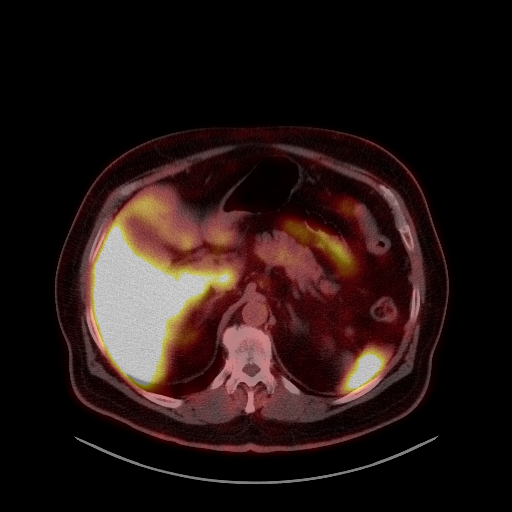
[im 179/239]
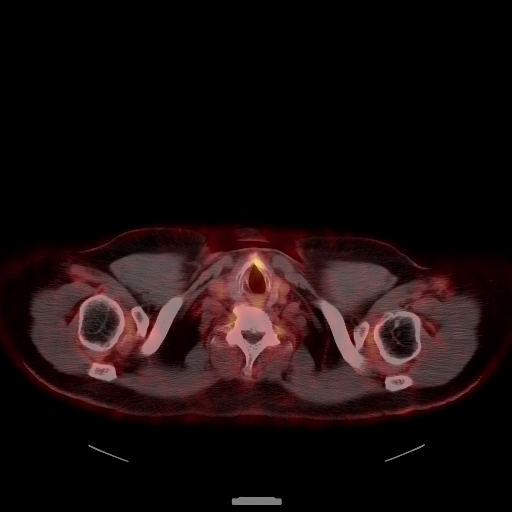
[im 239/239]
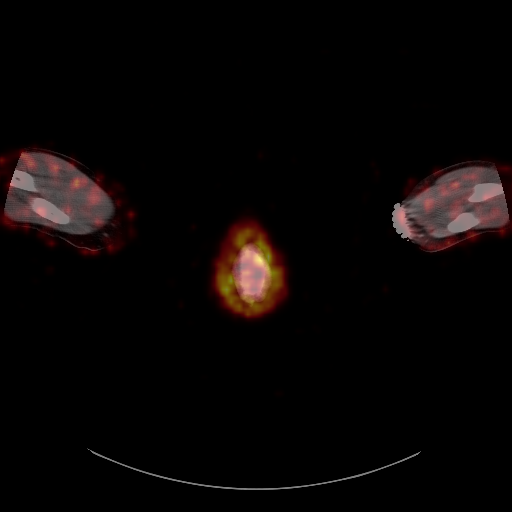

[Series 605: fused cor · 1 of 62 slices shown]
[im 1/62]
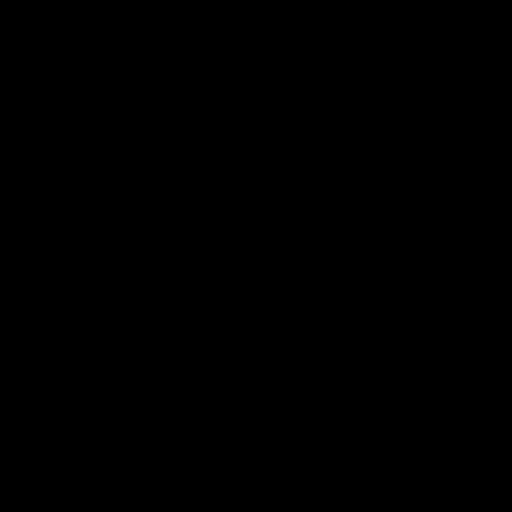

[25 of 25 positions shown; findings below may reference images not displayed]

FINDINGS: NECK

No radiotracer activity in neck lymph nodes.

Incidental CT finding: None

CHEST

No radiotracer accumulation within mediastinal or hilar lymph nodes.
No suspicious pulmonary nodules on the CT scan.

Incidental CT finding: Aortic and coronary atherosclerotic
calcification noted.

ABDOMEN/PELVIS

Prostate: Prior prostatectomy noted. No focal activity in the
prostate bed.

Lymph nodes: No abnormal radiotracer accumulation within pelvic or
abdominal nodes.

Liver: No evidence of liver metastasis

Incidental CT finding: Several tiny less than 5 mm left renal
calculi. Small fluid attenuation left renal cyst. Aortic
atherosclerotic calcification noted. Small midline epigastric
ventral hernia, which contains only omental fat. Diverticulosis is
seen mainly involving the sigmoid colon, however there is no
evidence of diverticulitis.

SKELETON

No focal  activity to suggest skeletal metastasis.
IMPRESSION: Prior prostatectomy. No evidence of locally recurrent or metastatic
prostate carcinoma.

## 2021-05-28 MED ORDER — PIFLIFOLASTAT F 18 (PYLARIFY) INJECTION: 9.0000 | Freq: Once | INTRAVENOUS | Status: DC

## 2021-05-29 ENCOUNTER — Ambulatory Visit
Admission: RE | Admit: 2021-05-29 | Discharge: 2021-05-29 | Disposition: A | Discharge status: Home / Self Care | Payer: Medicare Other | Source: Ambulatory Visit | Attending: Radiation Oncology | Admitting: Radiation Oncology

## 2021-05-29 VITALS — BP 138/69 | HR 80 | Temp 97.8°F | Resp 20 | Ht 67.0 in | Wt 225.0 lb

## 2021-05-29 DIAGNOSIS — Z87442 Personal history of urinary calculi: Secondary | ICD-10-CM | POA: Insufficient documentation

## 2021-05-29 DIAGNOSIS — Z79899 Other long term (current) drug therapy: Secondary | ICD-10-CM | POA: Insufficient documentation

## 2021-05-29 DIAGNOSIS — C61 Malignant neoplasm of prostate: Secondary | ICD-10-CM

## 2021-05-29 DIAGNOSIS — I10 Essential (primary) hypertension: Secondary | ICD-10-CM | POA: Insufficient documentation

## 2021-05-29 DIAGNOSIS — E785 Hyperlipidemia, unspecified: Secondary | ICD-10-CM | POA: Diagnosis not present

## 2021-05-29 DIAGNOSIS — R972 Elevated prostate specific antigen [PSA]: Secondary | ICD-10-CM | POA: Insufficient documentation

## 2021-05-29 DIAGNOSIS — Q899 Congenital malformation, unspecified: Secondary | ICD-10-CM | POA: Insufficient documentation

## 2021-05-29 DIAGNOSIS — N200 Calculus of kidney: Secondary | ICD-10-CM | POA: Insufficient documentation

## 2021-05-29 DIAGNOSIS — E782 Mixed hyperlipidemia: Secondary | ICD-10-CM | POA: Insufficient documentation

## 2021-05-29 DIAGNOSIS — K219 Gastro-esophageal reflux disease without esophagitis: Secondary | ICD-10-CM | POA: Insufficient documentation

## 2021-05-29 DIAGNOSIS — C775 Secondary and unspecified malignant neoplasm of intrapelvic lymph nodes: Secondary | ICD-10-CM | POA: Diagnosis not present

## 2021-05-29 DIAGNOSIS — B351 Tinea unguium: Secondary | ICD-10-CM | POA: Insufficient documentation

## 2021-05-29 DIAGNOSIS — N5201 Erectile dysfunction due to arterial insufficiency: Secondary | ICD-10-CM | POA: Insufficient documentation

## 2021-05-29 DIAGNOSIS — Z0000 Encounter for general adult medical examination without abnormal findings: Secondary | ICD-10-CM | POA: Insufficient documentation

## 2021-05-29 DIAGNOSIS — J392 Other diseases of pharynx: Secondary | ICD-10-CM | POA: Insufficient documentation

## 2021-05-29 DIAGNOSIS — K439 Ventral hernia without obstruction or gangrene: Secondary | ICD-10-CM | POA: Insufficient documentation

## 2021-05-29 DIAGNOSIS — K5730 Diverticulosis of large intestine without perforation or abscess without bleeding: Secondary | ICD-10-CM

## 2021-05-29 DIAGNOSIS — M722 Plantar fascial fibromatosis: Secondary | ICD-10-CM | POA: Insufficient documentation

## 2021-05-29 DIAGNOSIS — R9721 Rising PSA following treatment for malignant neoplasm of prostate: Secondary | ICD-10-CM | POA: Insufficient documentation

## 2021-05-29 DIAGNOSIS — S40019A Contusion of unspecified shoulder, initial encounter: Secondary | ICD-10-CM | POA: Insufficient documentation

## 2021-05-29 DIAGNOSIS — I2584 Coronary atherosclerosis due to calcified coronary lesion: Secondary | ICD-10-CM

## 2021-05-29 DIAGNOSIS — R739 Hyperglycemia, unspecified: Secondary | ICD-10-CM | POA: Insufficient documentation

## 2021-05-29 DIAGNOSIS — M5430 Sciatica, unspecified side: Secondary | ICD-10-CM | POA: Insufficient documentation

## 2021-05-29 DIAGNOSIS — I700 Atherosclerosis of aorta: Secondary | ICD-10-CM | POA: Insufficient documentation

## 2021-05-29 DIAGNOSIS — M7580 Other shoulder lesions, unspecified shoulder: Secondary | ICD-10-CM | POA: Insufficient documentation

## 2021-05-29 DIAGNOSIS — E559 Vitamin D deficiency, unspecified: Secondary | ICD-10-CM | POA: Insufficient documentation

## 2021-05-29 DIAGNOSIS — I2510 Atherosclerotic heart disease of native coronary artery without angina pectoris: Secondary | ICD-10-CM | POA: Insufficient documentation

## 2021-05-29 DIAGNOSIS — H6980 Other specified disorders of Eustachian tube, unspecified ear: Secondary | ICD-10-CM | POA: Insufficient documentation

## 2021-05-29 DIAGNOSIS — R059 Cough, unspecified: Secondary | ICD-10-CM | POA: Insufficient documentation

## 2021-05-29 NOTE — Progress Notes (Signed)
Radiation Oncology         (336) [phone] ________________________________  Initial Outpatient Consultation  Name: Dillon Arias MRN: [id number]  Date: 05/29/2021  DOB: 01/20/1951  HY:IFOYD, Herbie [place], MD  Raynelle Bring, MD   REFERRING PHYSICIAN: Raynelle Bring, MD  DIAGNOSIS: 70 y.o. gentleman with biochemically recurrent prostate cancer with a current PSA at 0.19, s/p UNS RALP with BPLND in September 2020 for pT3aN0, Gleason 4+4 adenocarcinoma of the prostate with a focal positive margin at the left posterior lateral mid gland.    ICD-10-CM   1. Biochemically recurrent malignant neoplasm of prostate ([place])  C61    R97.21       HISTORY OF PRESENT ILLNESS: Dillon Arias is a 70 y.o. male with a diagnosis of prostate cancer. He was initially noted to have an elevated PSA of 4.06 and was referred for evaluation in urology by Dr. Diona Fanti.  He proceeded to transrectal ultrasound with 12 biopsies of the prostate on 11/01/2018 and out of 12 core biopsies, 4 were positive.  The maximum Gleason score was 4+3, and this was seen in the left base lateral and left mid lateral.  He met with Dr. Alinda Money in consult and elected to proceed with prostatectomy for treatment of his disease.   He underwent a UNS RALP with BPLND on 02/10/2019 with surgical pathology confirming pT3aN0, Gleason 4+4 adenocarcinoma of the prostate with a focally positive margin and an area of extracapsular extension of the left posterior lateral mid gland.  His initial posttreatment PSA on 11/22/2019 was undetectable but became low detectable at 0.126 on 01/18/2021.  His most recent PSA on 04/30/2021 had increased further at 0.19.  This prompted a PSMA PET scan for further evaluation and this was performed on 05/28/2021 confirming no evidence of obvious locally recurrent or metastatic disease.    We did review the incidental findings which include several tiny, less than 5 mm renal calculi in the left kidney as well as a small, left  renal cyst.  There was also aortic atherosclerotic calcifications and a small, midline epigastric ventral hernia containing only omental fat.  There is evidence of diverticulosis, mainly involving the sigmoid colon but no evidence of diverticulitis.  Not noted in the report, there are evident coronary calcifications as well.  We will forward a copy of the scan report to his PCP, Dr. Inda Merlin.  He has been referred to [place] today to discuss the role of salvage radiotherapy to treat the suspected local, microscopic recurrence of disease.   PREVIOUS RADIATION THERAPY: No  PAST MEDICAL HISTORY:  Past Medical History:  Diagnosis Date   GERD (gastroesophageal reflux disease)    H/O renal calculi    History of kidney stones 2008   Hyperlipidemia    Hypertension       PAST SURGICAL HISTORY: Past Surgical History:  Procedure Laterality Date   COLONOSCOPY WITH PROPOFOL N/A 01/22/2015   Procedure: COLONOSCOPY WITH PROPOFOL;  Surgeon: Garlan Fair, MD;  Location: [facility];  Service: Endoscopy;  Laterality: N/A;   EXTRACORPOREAL SHOCK WAVE LITHOTRIPSY     HERNIA REPAIR     Left   KIDNEY STONE SURGERY     LYMPHADENECTOMY Bilateral 02/10/2019   Procedure: LYMPHADENECTOMY, PELVIC;  Surgeon: Raynelle Bring, MD;  Location: [facility];  Service: Urology;  Laterality: Bilateral;   PILONIDAL CYST EXCISION     PTOSIS REPAIR     ROBOT ASSISTED LAPAROSCOPIC RADICAL PROSTATECTOMY N/A 02/10/2019   Procedure: XI ROBOTIC ASSISTED LAPAROSCOPIC RADICAL PROSTATECTOMY LEVEL 2;  Surgeon: Raynelle Bring, MD;  Location: [facility];  Service: Urology;  Laterality: N/A;  ONLY NEEDS 210 MIN FOR ALL PROCEDURES    FAMILY HISTORY:  Family History  Problem Relation Age of Onset   Hypertension Mother    Nephrolithiasis Mother    Hypertension Father    Diabetes Father    Nephrolithiasis Father     SOCIAL HISTORY: 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 He lives in  [place] with his wife. Social History   Socioeconomic History   Marital status: Married    Spouse name: Not on file   Number of children: Not on file   Years of education: Not on file   Highest education level: Not on file  Occupational History   Not on file  Tobacco Use   Smoking status: Never   Smokeless tobacco: Never  Vaping Use   Vaping Use: Never used  Substance and Sexual Activity   Alcohol use: No   Drug use: No   Sexual activity: Not on file  Other Topics Concern   Not on file  Social History Narrative   Not on file   Social Determinants of Health   Financial Resource Strain: Not on file  Food Insecurity: Not on file  Transportation Needs: Not on file  Physical Activity: Not on file  Stress: Not on file  Social Connections: Not on file  Intimate Partner Violence: Not on file    ALLERGIES: Codeine, Epinephrine, and Terbinafine and related  MEDICATIONS:  Current Outpatient Medications  Medication Sig Dispense Refill   atorvastatin (LIPITOR) 20 MG tablet Take 20 mg by mouth daily.      atorvastatin (LIPITOR) 20 MG tablet TAKE 1 TABLET BY MOUTH ONCE DAILY 90 tablet 3   famotidine (PEPCID) 20 MG tablet Take 20 mg by mouth daily.     famotidine (PEPCID) 20 MG tablet TAKE 1 TABLET BY MOUTH ONCE A DAY IN THE MORNING 90 tablet 3   famotidine (PEPCID) 20 MG tablet Take 1 tablet (20 mg total) by mouth in the morning. 90 tablet 4   fexofenadine (ALLEGRA) 180 MG tablet Take 180 mg by mouth daily.     fluticasone (FLONASE) 50 MCG/ACT nasal spray Place 2 sprays into both nostrils daily. (Patient not taking: Reported on 01/28/2019) 9.9 g 2   pantoprazole (PROTONIX) 40 MG tablet Take 40 mg by mouth every evening.      pantoprazole (PROTONIX) 40 MG tablet TAKE 1 TABLET BY MOUTH ONCE DAILY, UP TO 5 TIMES MAX PER WEEK FOR GERD 60 tablet 3   pantoprazole (PROTONIX) 40 MG tablet Take 1 tablet (40 mg total) by mouth daily. Take no more than 5 times weekly for GERD 60 tablet 3    predniSONE (DELTASONE) 10 MG tablet Take 4 tablets by mouth in the morning with food for 3 days then decrease by 1 tablet every 3 days until gone. Take once daily. 4-4-4-3-3-3-2-2-2-1-1-1 30 tablet 1   sulfamethoxazole-trimethoprim (BACTRIM DS) 800-160 MG tablet Take 1 tablet by mouth 2 (two) times daily. Start the day prior to foley removal appointment 6 tablet 0   tadalafil (CIALIS) 5 MG tablet TAKE 1 TABLET BY MOUTH ONCE DAILY. 30 tablet 11   tadalafil (CIALIS) 5 MG tablet Take 1 tablet (5 mg total) by mouth daily. 90 tablet 3   traMADol (ULTRAM) 50 MG tablet Take 1-2 tablets (50-100 mg total) by mouth every 6 (six) hours as needed for moderate pain or  severe pain. 20 tablet 0   valsartan-hydrochlorothiazide (DIOVAN-HCT) 160-12.5 MG tablet Take 1 tablet by mouth daily.     valsartan-hydrochlorothiazide (DIOVAN-HCT) 160-12.5 MG tablet TAKE 1 TABLET BY MOUTH ONCE A DAY 90 tablet 3   valsartan-hydrochlorothiazide (DIOVAN-HCT) 160-12.5 MG tablet Take 1 tablet by mouth daily. 90 tablet 4   No current facility-administered medications for this visit.   Facility-Administered Medications Ordered in Other Visits  Medication Dose Route Frequency Provider Last Rate Last Admin   piflufolastat F 18 (PYLARIFY) injection 9 millicurie  9 millicurie Intravenous Once Macy Mis, MD        REVIEW OF SYSTEMS:  On review of systems, the patient reports that he is doing well overall. He denies any chest pain, shortness of breath, cough, fevers, chills, night sweats, unintended weight changes. He denies any bowel disturbances, and denies abdominal pain, nausea or vomiting. He denies any new musculoskeletal or joint aches or pains. His IPSS was 0, indicating he has no urinary symptoms. He has regained full continence since surgery and is quite pleased. His SHIM was 5, indicating he has severe postoperative erectile dysfunction. A complete review of systems is obtained and is otherwise negative.    PHYSICAL  EXAM:  Wt Readings from Last 3 Encounters:  02/10/19 225 lb 1.4 oz (102.1 kg)  02/03/19 226 lb 5 oz (102.7 kg)  12/04/17 232 lb (105.2 kg)   Temp Readings from Last 3 Encounters:  02/11/19 98 F (36.7 C) (Oral)  02/03/19 98.3 F (36.8 C) (Oral)  12/04/17 98.3 F (36.8 C) (Oral)   BP Readings from Last 3 Encounters:  02/11/19 138/71  02/03/19 (!) 147/77  12/04/17 (!) 164/80   Pulse Readings from Last 3 Encounters:  02/11/19 74  02/03/19 76  12/04/17 76    /10  In general this is a well appearing Caucasian male in no acute distress. He's alert and oriented x4 and appropriate throughout the examination. Cardiopulmonary assessment is negative for acute distress, and he exhibits normal effort.     KPS = 100  100 - Normal; no complaints; no evidence of disease. 90   - Able to carry on normal activity; minor signs or symptoms of disease. 80   - Normal activity with effort; some signs or symptoms of disease. 26   - Cares for self; unable to carry on normal activity or to do active work. 60   - Requires occasional assistance, but is able to care for most of his personal needs. 50   - Requires considerable assistance and frequent medical care. 66   - Disabled; requires special care and assistance. 29   - Severely disabled; hospital admission is indicated although death not imminent. 27   - Very sick; hospital admission necessary; active supportive treatment necessary. 10   - Moribund; fatal processes progressing rapidly. 0     - Dead  Karnofsky DA, Abelmann Crivitz, Craver LS and Burchenal [facility] [phone]) The use of the nitrogen mustards in the palliative treatment of carcinoma: with particular reference to bronchogenic carcinoma Cancer 1 634-56  LABORATORY DATA:  Lab Results  Component Value Date   WBC 8.6 02/03/2019   HGB 13.4 02/11/2019   HCT 41.5 02/11/2019   MCV 95.4 02/03/2019   PLT 256 02/03/2019   Lab Results  Component Value Date   NA 137 02/03/2019   K 3.7 02/03/2019    CL 104 02/03/2019   CO2 25 02/03/2019   Lab Results  Component Value Date   ALT 24 10/21/2014  AST 20 10/21/2014   ALKPHOS 65 10/21/2014   BILITOT 0.8 10/21/2014     RADIOGRAPHY: NM PET (PSMA) SKULL TO MID THIGH  Result Date: 05/29/2021 CLINICAL DATA:  Prostate carcinoma with biochemical recurrence. Prior prostatectomy. EXAM: NUCLEAR MEDICINE PET SKULL BASE TO THIGH TECHNIQUE: 10.0 mCi F18 Piflufolastat (Pylarify) was injected intravenously. Full-ring PET imaging was performed from the skull base to thigh after the radiotracer. CT data was obtained and used for attenuation correction and anatomic localization. COMPARISON:  CT on 11/09/2018 FINDINGS: NECK No radiotracer activity in neck lymph nodes. Incidental CT finding: None CHEST No radiotracer accumulation within mediastinal or hilar lymph nodes. No suspicious pulmonary nodules on the CT scan. Incidental CT finding: Aortic and coronary atherosclerotic calcification noted. ABDOMEN/PELVIS Prostate: Prior prostatectomy noted. No focal activity in the prostate bed. Lymph nodes: No abnormal radiotracer accumulation within pelvic or abdominal nodes. Liver: No evidence of liver metastasis Incidental CT finding: Several tiny less than 5 mm left renal calculi. Small fluid attenuation left renal cyst. Aortic atherosclerotic calcification noted. Small midline epigastric ventral hernia, which contains only omental fat. Diverticulosis is seen mainly involving the sigmoid colon, however there is no evidence of diverticulitis. SKELETON No focal  activity to suggest skeletal metastasis. IMPRESSION: Prior prostatectomy. No evidence of locally recurrent or metastatic prostate carcinoma. Electronically Signed   By: Marlaine Hind M.D.   On: 05/29/2021 11:54      IMPRESSION/PLAN: 1. 70 y.o. gentleman with biochemically recurrent prostate cancer with a current PSA at 0.19, s/p UNS RALP with BPLND September 2020 for pT3aN0, Gleason 4+4 adenocarcinoma of the prostate  with a focal positive margin at the left posterior lateral mid gland.  Today, we talked to the patient and his wife about the findings and workup thus far. We discussed the natural history of biochemically recurrent adenocarcinoma of the prostate and general treatment, highlighting the role of salvage radiotherapy in the management. We discussed the available radiation techniques, and focused on the details and logistics of delivery.  The recommendation is for a 7-1/2-week course of daily, salvage radiotherapy to the prostate fossa and pelvic nodes.  We reviewed the anticipated acute and late sequelae associated with radiation in this setting. The patient was encouraged to ask questions that were answered to his stated satisfaction.   At the conclusion of our conversation, the patient is interested in moving forward with the recommended 7-1/2-week course of daily, salvage radiotherapy to the prostate fossa and pelvic nodes. We will share our discussion with Dr. Alinda Money and move forward with scheduling CT Simulation in anticipation of beginning his daily treatments in the near future. We enjoyed meeting him and his wife and look forward to continuing to participate in his care.  We personally spent 90 minutes in this encounter including chart review, reviewing radiological studies, meeting face-to-face with the patient, entering orders and completing documentation.    Nicholos Johns, PA-C    Tyler Pita, MD  [facility] Direct Dial: [phone]   Fax: [phone] [email or web address]   Skype   [email or web address]

## 2021-05-30 ENCOUNTER — Institutional Professional Consult (permissible substitution): Payer: Medicare Other | Admitting: Radiation Oncology

## 2021-05-30 ENCOUNTER — Ambulatory Visit: Payer: Medicare Other

## 2021-05-31 ENCOUNTER — Ambulatory Visit
Admission: RE | Admit: 2021-05-31 | Discharge: 2021-05-31 | Disposition: A | Discharge status: Home / Self Care | Payer: Medicare Other | Source: Ambulatory Visit | Attending: Radiation Oncology | Admitting: Radiation Oncology

## 2021-05-31 ENCOUNTER — Other Ambulatory Visit: Payer: Self-pay

## 2021-05-31 DIAGNOSIS — C61 Malignant neoplasm of prostate: Secondary | ICD-10-CM | POA: Diagnosis not present

## 2021-05-31 DIAGNOSIS — Z510 Encounter for antineoplastic radiation therapy: Secondary | ICD-10-CM | POA: Diagnosis not present

## 2021-05-31 NOTE — Progress Notes (Signed)
°  Radiation Oncology         (336) [phone] ________________________________  Name: Dillon Arias MRN: [id number]  Date: 05/31/2021  DOB: [birth year]  SIMULATION AND TREATMENT PLANNING NOTE    ICD-10-CM   1. Prostate cancer ([place])  C61     2. Biochemically recurrent malignant neoplasm of prostate [facility])  C61    R97.21       DIAGNOSIS:  70 y.o. gentleman with biochemically recurrent prostate cancer with a current PSA at 0.19, s/p UNS RALP with BPLND in September 2020 for pT3aN0, Gleason 4+4 adenocarcinoma of the prostate with a focal positive margin at the left posterior lateral mid gland.  NARRATIVE:  The patient was brought to the [facility].  Identity was confirmed.  All relevant records and images related to the planned course of therapy were reviewed.  The patient freely provided informed written consent to proceed with treatment after reviewing the details related to the planned course of therapy. The consent form was witnessed and verified by the simulation staff.  Then, the patient was set-up in a stable reproducible supine position for radiation therapy.  A vacuum lock pillow device was custom fabricated to position his legs in a reproducible immobilized position.  Then, I performed a urethrogram under sterile conditions to identify the prostatic bed.  CT images were obtained.  Surface markings were placed.  The CT images were loaded into the planning software.  Then the prostate bed target, pelvic lymph node target and avoidance structures including the rectum, bladder, bowel and hips were contoured.  Treatment planning then occurred.  The radiation prescription was entered and confirmed.  A total of one complex treatment devices were fabricated. I have requested : Intensity Modulated Radiotherapy (IMRT) is medically necessary for this case for the following reason:  Rectal sparing.Marland Kitchen  PLAN:  The patient will receive 45 Gy in 25 fractions of 1.8 Gy, followed by a boost to  the prostate bed to a total dose of 68.4 Gy with 13 additional fractions of 1.8 Gy.   ________________________________  Sheral Apley Tammi Klippel, M.D.

## 2021-06-07 DIAGNOSIS — C61 Malignant neoplasm of prostate: Secondary | ICD-10-CM | POA: Insufficient documentation

## 2021-06-07 DIAGNOSIS — Z510 Encounter for antineoplastic radiation therapy: Secondary | ICD-10-CM | POA: Diagnosis not present

## 2021-06-11 ENCOUNTER — Ambulatory Visit
Admission: RE | Admit: 2021-06-11 | Discharge: 2021-06-11 | Disposition: A | Discharge status: Home / Self Care | Payer: Medicare Other | Source: Ambulatory Visit | Attending: Radiation Oncology | Admitting: Radiation Oncology

## 2021-06-11 ENCOUNTER — Other Ambulatory Visit: Payer: Self-pay

## 2021-06-11 DIAGNOSIS — Z510 Encounter for antineoplastic radiation therapy: Secondary | ICD-10-CM | POA: Diagnosis not present

## 2021-06-11 DIAGNOSIS — C61 Malignant neoplasm of prostate: Secondary | ICD-10-CM | POA: Diagnosis not present

## 2021-06-12 ENCOUNTER — Ambulatory Visit
Admission: RE | Admit: 2021-06-12 | Discharge: 2021-06-12 | Disposition: A | Discharge status: Home / Self Care | Payer: Medicare Other | Source: Ambulatory Visit | Attending: Radiation Oncology | Admitting: Radiation Oncology

## 2021-06-12 ENCOUNTER — Other Ambulatory Visit: Payer: Self-pay

## 2021-06-12 DIAGNOSIS — Z510 Encounter for antineoplastic radiation therapy: Secondary | ICD-10-CM | POA: Diagnosis not present

## 2021-06-12 DIAGNOSIS — C61 Malignant neoplasm of prostate: Secondary | ICD-10-CM | POA: Diagnosis not present

## 2021-06-13 ENCOUNTER — Other Ambulatory Visit (HOSPITAL_COMMUNITY): Payer: Self-pay

## 2021-06-13 ENCOUNTER — Ambulatory Visit
Admission: RE | Admit: 2021-06-13 | Discharge: 2021-06-13 | Disposition: A | Discharge status: Home / Self Care | Payer: Medicare Other | Source: Ambulatory Visit | Attending: Radiation Oncology | Admitting: Radiation Oncology

## 2021-06-13 DIAGNOSIS — C61 Malignant neoplasm of prostate: Secondary | ICD-10-CM | POA: Diagnosis not present

## 2021-06-13 DIAGNOSIS — Z510 Encounter for antineoplastic radiation therapy: Secondary | ICD-10-CM | POA: Diagnosis not present

## 2021-06-13 MED ORDER — TADALAFIL 5 MG PO TABS
5.0000 mg | ORAL_TABLET | Freq: Every day | ORAL | 11 refills | Status: DC
  Filled 2021-06-13: qty 30, 30d supply, fill #0
  Filled 2021-09-05: qty 30, 30d supply, fill #1
  Filled 2022-01-27: qty 30, 30d supply, fill #2

## 2021-06-14 ENCOUNTER — Other Ambulatory Visit: Payer: Self-pay

## 2021-06-14 ENCOUNTER — Ambulatory Visit
Admission: RE | Admit: 2021-06-14 | Discharge: 2021-06-14 | Disposition: A | Discharge status: Home / Self Care | Payer: Medicare Other | Source: Ambulatory Visit | Attending: Radiation Oncology | Admitting: Radiation Oncology

## 2021-06-14 DIAGNOSIS — C61 Malignant neoplasm of prostate: Secondary | ICD-10-CM | POA: Diagnosis not present

## 2021-06-14 DIAGNOSIS — Z510 Encounter for antineoplastic radiation therapy: Secondary | ICD-10-CM | POA: Diagnosis not present

## 2021-06-17 ENCOUNTER — Other Ambulatory Visit: Payer: Self-pay

## 2021-06-17 ENCOUNTER — Ambulatory Visit
Admission: RE | Admit: 2021-06-17 | Discharge: 2021-06-17 | Disposition: A | Discharge status: Home / Self Care | Payer: Medicare Other | Source: Ambulatory Visit | Attending: Radiation Oncology | Admitting: Radiation Oncology

## 2021-06-17 DIAGNOSIS — Z510 Encounter for antineoplastic radiation therapy: Secondary | ICD-10-CM | POA: Diagnosis not present

## 2021-06-17 DIAGNOSIS — C61 Malignant neoplasm of prostate: Secondary | ICD-10-CM | POA: Diagnosis not present

## 2021-06-18 ENCOUNTER — Ambulatory Visit
Admission: RE | Admit: 2021-06-18 | Discharge: 2021-06-18 | Disposition: A | Discharge status: Home / Self Care | Payer: Medicare Other | Source: Ambulatory Visit | Attending: Radiation Oncology | Admitting: Radiation Oncology

## 2021-06-18 DIAGNOSIS — C61 Malignant neoplasm of prostate: Secondary | ICD-10-CM | POA: Diagnosis not present

## 2021-06-18 DIAGNOSIS — Z510 Encounter for antineoplastic radiation therapy: Secondary | ICD-10-CM | POA: Diagnosis not present

## 2021-06-19 ENCOUNTER — Other Ambulatory Visit: Payer: Self-pay

## 2021-06-19 ENCOUNTER — Ambulatory Visit
Admission: RE | Admit: 2021-06-19 | Discharge: 2021-06-19 | Disposition: A | Discharge status: Home / Self Care | Payer: Medicare Other | Source: Ambulatory Visit | Attending: Radiation Oncology | Admitting: Radiation Oncology

## 2021-06-19 DIAGNOSIS — C61 Malignant neoplasm of prostate: Secondary | ICD-10-CM | POA: Diagnosis not present

## 2021-06-19 DIAGNOSIS — Z510 Encounter for antineoplastic radiation therapy: Secondary | ICD-10-CM | POA: Diagnosis not present

## 2021-06-20 ENCOUNTER — Ambulatory Visit
Admission: RE | Admit: 2021-06-20 | Discharge: 2021-06-20 | Disposition: A | Discharge status: Home / Self Care | Payer: Medicare Other | Source: Ambulatory Visit | Attending: Radiation Oncology | Admitting: Radiation Oncology

## 2021-06-20 DIAGNOSIS — Z510 Encounter for antineoplastic radiation therapy: Secondary | ICD-10-CM | POA: Diagnosis not present

## 2021-06-20 DIAGNOSIS — C61 Malignant neoplasm of prostate: Secondary | ICD-10-CM | POA: Diagnosis not present

## 2021-06-21 ENCOUNTER — Other Ambulatory Visit: Payer: Self-pay

## 2021-06-21 ENCOUNTER — Ambulatory Visit
Admission: RE | Admit: 2021-06-21 | Discharge: 2021-06-21 | Disposition: A | Discharge status: Home / Self Care | Payer: Medicare Other | Source: Ambulatory Visit | Attending: Radiation Oncology | Admitting: Radiation Oncology

## 2021-06-21 DIAGNOSIS — C61 Malignant neoplasm of prostate: Secondary | ICD-10-CM | POA: Diagnosis not present

## 2021-06-21 DIAGNOSIS — Z510 Encounter for antineoplastic radiation therapy: Secondary | ICD-10-CM | POA: Diagnosis not present

## 2021-06-24 ENCOUNTER — Other Ambulatory Visit: Payer: Self-pay

## 2021-06-24 ENCOUNTER — Ambulatory Visit
Admission: RE | Admit: 2021-06-24 | Discharge: 2021-06-24 | Disposition: A | Discharge status: Home / Self Care | Payer: Medicare Other | Source: Ambulatory Visit | Attending: Radiation Oncology | Admitting: Radiation Oncology

## 2021-06-24 DIAGNOSIS — C61 Malignant neoplasm of prostate: Secondary | ICD-10-CM | POA: Diagnosis not present

## 2021-06-24 DIAGNOSIS — Z510 Encounter for antineoplastic radiation therapy: Secondary | ICD-10-CM | POA: Diagnosis not present

## 2021-06-25 ENCOUNTER — Ambulatory Visit
Admission: RE | Admit: 2021-06-25 | Discharge: 2021-06-25 | Disposition: A | Discharge status: Home / Self Care | Payer: Medicare Other | Source: Ambulatory Visit | Attending: Radiation Oncology | Admitting: Radiation Oncology

## 2021-06-25 DIAGNOSIS — Z510 Encounter for antineoplastic radiation therapy: Secondary | ICD-10-CM | POA: Diagnosis not present

## 2021-06-25 DIAGNOSIS — C61 Malignant neoplasm of prostate: Secondary | ICD-10-CM | POA: Diagnosis not present

## 2021-06-26 ENCOUNTER — Ambulatory Visit
Admission: RE | Admit: 2021-06-26 | Discharge: 2021-06-26 | Disposition: A | Discharge status: Home / Self Care | Payer: Medicare Other | Source: Ambulatory Visit | Attending: Radiation Oncology | Admitting: Radiation Oncology

## 2021-06-26 ENCOUNTER — Other Ambulatory Visit: Payer: Self-pay

## 2021-06-26 DIAGNOSIS — Z510 Encounter for antineoplastic radiation therapy: Secondary | ICD-10-CM | POA: Diagnosis not present

## 2021-06-26 DIAGNOSIS — C61 Malignant neoplasm of prostate: Secondary | ICD-10-CM | POA: Diagnosis not present

## 2021-06-27 ENCOUNTER — Ambulatory Visit
Admission: RE | Admit: 2021-06-27 | Discharge: 2021-06-27 | Disposition: A | Discharge status: Home / Self Care | Payer: Medicare Other | Source: Ambulatory Visit | Attending: Radiation Oncology | Admitting: Radiation Oncology

## 2021-06-27 DIAGNOSIS — Z510 Encounter for antineoplastic radiation therapy: Secondary | ICD-10-CM | POA: Diagnosis not present

## 2021-06-27 DIAGNOSIS — C61 Malignant neoplasm of prostate: Secondary | ICD-10-CM | POA: Diagnosis not present

## 2021-06-28 ENCOUNTER — Ambulatory Visit
Admission: RE | Admit: 2021-06-28 | Discharge: 2021-06-28 | Disposition: A | Discharge status: Home / Self Care | Payer: Medicare Other | Source: Ambulatory Visit | Attending: Radiation Oncology | Admitting: Radiation Oncology

## 2021-06-28 ENCOUNTER — Other Ambulatory Visit: Payer: Self-pay

## 2021-06-28 DIAGNOSIS — C61 Malignant neoplasm of prostate: Secondary | ICD-10-CM | POA: Diagnosis not present

## 2021-06-28 DIAGNOSIS — Z510 Encounter for antineoplastic radiation therapy: Secondary | ICD-10-CM | POA: Diagnosis not present

## 2021-07-01 ENCOUNTER — Other Ambulatory Visit: Payer: Self-pay

## 2021-07-01 ENCOUNTER — Ambulatory Visit
Admission: RE | Admit: 2021-07-01 | Discharge: 2021-07-01 | Disposition: A | Discharge status: Home / Self Care | Payer: Medicare Other | Source: Ambulatory Visit | Attending: Radiation Oncology | Admitting: Radiation Oncology

## 2021-07-01 DIAGNOSIS — Z510 Encounter for antineoplastic radiation therapy: Secondary | ICD-10-CM | POA: Diagnosis not present

## 2021-07-01 DIAGNOSIS — C61 Malignant neoplasm of prostate: Secondary | ICD-10-CM | POA: Diagnosis not present

## 2021-07-02 ENCOUNTER — Ambulatory Visit
Admission: RE | Admit: 2021-07-02 | Discharge: 2021-07-02 | Disposition: A | Discharge status: Home / Self Care | Payer: Medicare Other | Source: Ambulatory Visit | Attending: Radiation Oncology | Admitting: Radiation Oncology

## 2021-07-02 DIAGNOSIS — Z510 Encounter for antineoplastic radiation therapy: Secondary | ICD-10-CM | POA: Diagnosis not present

## 2021-07-02 DIAGNOSIS — C61 Malignant neoplasm of prostate: Secondary | ICD-10-CM | POA: Diagnosis not present

## 2021-07-03 ENCOUNTER — Ambulatory Visit
Admission: RE | Admit: 2021-07-03 | Discharge: 2021-07-03 | Disposition: A | Discharge status: Home / Self Care | Payer: Medicare Other | Source: Ambulatory Visit | Attending: Radiation Oncology | Admitting: Radiation Oncology

## 2021-07-03 ENCOUNTER — Other Ambulatory Visit: Payer: Self-pay

## 2021-07-03 DIAGNOSIS — Z510 Encounter for antineoplastic radiation therapy: Secondary | ICD-10-CM | POA: Diagnosis not present

## 2021-07-03 DIAGNOSIS — I10 Essential (primary) hypertension: Secondary | ICD-10-CM | POA: Diagnosis not present

## 2021-07-03 DIAGNOSIS — E782 Mixed hyperlipidemia: Secondary | ICD-10-CM | POA: Diagnosis not present

## 2021-07-03 DIAGNOSIS — E559 Vitamin D deficiency, unspecified: Secondary | ICD-10-CM | POA: Diagnosis not present

## 2021-07-03 DIAGNOSIS — C61 Malignant neoplasm of prostate: Secondary | ICD-10-CM | POA: Insufficient documentation

## 2021-07-03 DIAGNOSIS — Z79899 Other long term (current) drug therapy: Secondary | ICD-10-CM | POA: Diagnosis not present

## 2021-07-03 DIAGNOSIS — Z1389 Encounter for screening for other disorder: Secondary | ICD-10-CM | POA: Diagnosis not present

## 2021-07-03 DIAGNOSIS — Z0000 Encounter for general adult medical examination without abnormal findings: Secondary | ICD-10-CM | POA: Diagnosis not present

## 2021-07-03 DIAGNOSIS — F439 Reaction to severe stress, unspecified: Secondary | ICD-10-CM | POA: Diagnosis not present

## 2021-07-03 DIAGNOSIS — M5432 Sciatica, left side: Secondary | ICD-10-CM | POA: Diagnosis not present

## 2021-07-04 ENCOUNTER — Ambulatory Visit
Admission: RE | Admit: 2021-07-04 | Discharge: 2021-07-04 | Disposition: A | Discharge status: Home / Self Care | Payer: Medicare Other | Source: Ambulatory Visit | Attending: Radiation Oncology | Admitting: Radiation Oncology

## 2021-07-04 ENCOUNTER — Other Ambulatory Visit (HOSPITAL_COMMUNITY): Payer: Self-pay

## 2021-07-04 DIAGNOSIS — C61 Malignant neoplasm of prostate: Secondary | ICD-10-CM | POA: Diagnosis not present

## 2021-07-04 DIAGNOSIS — Z510 Encounter for antineoplastic radiation therapy: Secondary | ICD-10-CM | POA: Diagnosis not present

## 2021-07-04 MED ORDER — ATORVASTATIN CALCIUM 20 MG PO TABS
20.0000 mg | ORAL_TABLET | Freq: Every day | ORAL | 3 refills | Status: DC
  Filled 2021-07-04: qty 90, 90d supply, fill #0
  Filled 2021-11-03: qty 90, 90d supply, fill #1
  Filled 2022-01-27: qty 90, 90d supply, fill #2
  Filled 2022-04-28: qty 90, 90d supply, fill #3

## 2021-07-05 ENCOUNTER — Ambulatory Visit
Admission: RE | Admit: 2021-07-05 | Discharge: 2021-07-05 | Disposition: A | Discharge status: Home / Self Care | Payer: Medicare Other | Source: Ambulatory Visit | Attending: Radiation Oncology | Admitting: Radiation Oncology

## 2021-07-05 DIAGNOSIS — C61 Malignant neoplasm of prostate: Secondary | ICD-10-CM | POA: Diagnosis not present

## 2021-07-05 DIAGNOSIS — Z510 Encounter for antineoplastic radiation therapy: Secondary | ICD-10-CM | POA: Diagnosis not present

## 2021-07-08 ENCOUNTER — Ambulatory Visit
Admission: RE | Admit: 2021-07-08 | Discharge: 2021-07-08 | Disposition: A | Discharge status: Home / Self Care | Payer: Medicare Other | Source: Ambulatory Visit | Attending: Radiation Oncology | Admitting: Radiation Oncology

## 2021-07-08 ENCOUNTER — Other Ambulatory Visit: Payer: Self-pay

## 2021-07-08 DIAGNOSIS — Z510 Encounter for antineoplastic radiation therapy: Secondary | ICD-10-CM | POA: Diagnosis not present

## 2021-07-08 DIAGNOSIS — C61 Malignant neoplasm of prostate: Secondary | ICD-10-CM | POA: Diagnosis not present

## 2021-07-09 ENCOUNTER — Ambulatory Visit
Admission: RE | Admit: 2021-07-09 | Discharge: 2021-07-09 | Disposition: A | Discharge status: Home / Self Care | Payer: Medicare Other | Source: Ambulatory Visit | Attending: Radiation Oncology | Admitting: Radiation Oncology

## 2021-07-09 DIAGNOSIS — C61 Malignant neoplasm of prostate: Secondary | ICD-10-CM | POA: Diagnosis not present

## 2021-07-09 DIAGNOSIS — Z510 Encounter for antineoplastic radiation therapy: Secondary | ICD-10-CM | POA: Diagnosis not present

## 2021-07-10 ENCOUNTER — Other Ambulatory Visit: Payer: Self-pay

## 2021-07-10 ENCOUNTER — Ambulatory Visit
Admission: RE | Admit: 2021-07-10 | Discharge: 2021-07-10 | Disposition: A | Discharge status: Home / Self Care | Payer: Medicare Other | Source: Ambulatory Visit | Attending: Radiation Oncology | Admitting: Radiation Oncology

## 2021-07-10 DIAGNOSIS — C61 Malignant neoplasm of prostate: Secondary | ICD-10-CM | POA: Diagnosis not present

## 2021-07-10 DIAGNOSIS — Z510 Encounter for antineoplastic radiation therapy: Secondary | ICD-10-CM | POA: Diagnosis not present

## 2021-07-11 ENCOUNTER — Ambulatory Visit
Admission: RE | Admit: 2021-07-11 | Discharge: 2021-07-11 | Disposition: A | Discharge status: Home / Self Care | Payer: Medicare Other | Source: Ambulatory Visit | Attending: Radiation Oncology | Admitting: Radiation Oncology

## 2021-07-11 DIAGNOSIS — C61 Malignant neoplasm of prostate: Secondary | ICD-10-CM | POA: Diagnosis not present

## 2021-07-11 DIAGNOSIS — Z510 Encounter for antineoplastic radiation therapy: Secondary | ICD-10-CM | POA: Diagnosis not present

## 2021-07-12 ENCOUNTER — Other Ambulatory Visit: Payer: Self-pay

## 2021-07-12 ENCOUNTER — Ambulatory Visit
Admission: RE | Admit: 2021-07-12 | Discharge: 2021-07-12 | Disposition: A | Discharge status: Home / Self Care | Payer: Medicare Other | Source: Ambulatory Visit | Attending: Radiation Oncology | Admitting: Radiation Oncology

## 2021-07-12 DIAGNOSIS — C61 Malignant neoplasm of prostate: Secondary | ICD-10-CM | POA: Diagnosis not present

## 2021-07-12 DIAGNOSIS — Z510 Encounter for antineoplastic radiation therapy: Secondary | ICD-10-CM | POA: Diagnosis not present

## 2021-07-15 ENCOUNTER — Other Ambulatory Visit: Payer: Self-pay

## 2021-07-15 ENCOUNTER — Ambulatory Visit
Admission: RE | Admit: 2021-07-15 | Discharge: 2021-07-15 | Disposition: A | Discharge status: Home / Self Care | Payer: Medicare Other | Source: Ambulatory Visit | Attending: Radiation Oncology | Admitting: Radiation Oncology

## 2021-07-15 DIAGNOSIS — C61 Malignant neoplasm of prostate: Secondary | ICD-10-CM | POA: Diagnosis not present

## 2021-07-15 DIAGNOSIS — Z510 Encounter for antineoplastic radiation therapy: Secondary | ICD-10-CM | POA: Diagnosis not present

## 2021-07-16 ENCOUNTER — Ambulatory Visit
Admission: RE | Admit: 2021-07-16 | Discharge: 2021-07-16 | Disposition: A | Discharge status: Home / Self Care | Payer: Medicare Other | Source: Ambulatory Visit | Attending: Radiation Oncology | Admitting: Radiation Oncology

## 2021-07-16 DIAGNOSIS — Z510 Encounter for antineoplastic radiation therapy: Secondary | ICD-10-CM | POA: Diagnosis not present

## 2021-07-16 DIAGNOSIS — C61 Malignant neoplasm of prostate: Secondary | ICD-10-CM | POA: Diagnosis not present

## 2021-07-17 ENCOUNTER — Ambulatory Visit
Admission: RE | Admit: 2021-07-17 | Discharge: 2021-07-17 | Disposition: A | Discharge status: Home / Self Care | Payer: Medicare Other | Source: Ambulatory Visit | Attending: Radiation Oncology | Admitting: Radiation Oncology

## 2021-07-17 ENCOUNTER — Other Ambulatory Visit: Payer: Self-pay

## 2021-07-17 DIAGNOSIS — C61 Malignant neoplasm of prostate: Secondary | ICD-10-CM | POA: Diagnosis not present

## 2021-07-17 DIAGNOSIS — Z510 Encounter for antineoplastic radiation therapy: Secondary | ICD-10-CM | POA: Diagnosis not present

## 2021-07-18 ENCOUNTER — Ambulatory Visit
Admission: RE | Admit: 2021-07-18 | Discharge: 2021-07-18 | Disposition: A | Discharge status: Home / Self Care | Payer: Medicare Other | Source: Ambulatory Visit | Attending: Radiation Oncology | Admitting: Radiation Oncology

## 2021-07-18 DIAGNOSIS — C61 Malignant neoplasm of prostate: Secondary | ICD-10-CM | POA: Diagnosis not present

## 2021-07-18 DIAGNOSIS — Z510 Encounter for antineoplastic radiation therapy: Secondary | ICD-10-CM | POA: Diagnosis not present

## 2021-07-19 ENCOUNTER — Other Ambulatory Visit: Payer: Self-pay

## 2021-07-19 ENCOUNTER — Ambulatory Visit
Admission: RE | Admit: 2021-07-19 | Discharge: 2021-07-19 | Disposition: A | Discharge status: Home / Self Care | Payer: Medicare Other | Source: Ambulatory Visit | Attending: Radiation Oncology | Admitting: Radiation Oncology

## 2021-07-19 DIAGNOSIS — C61 Malignant neoplasm of prostate: Secondary | ICD-10-CM | POA: Diagnosis not present

## 2021-07-19 DIAGNOSIS — Z510 Encounter for antineoplastic radiation therapy: Secondary | ICD-10-CM | POA: Diagnosis not present

## 2021-07-22 ENCOUNTER — Ambulatory Visit
Admission: RE | Admit: 2021-07-22 | Discharge: 2021-07-22 | Disposition: A | Discharge status: Home / Self Care | Payer: Medicare Other | Source: Ambulatory Visit | Attending: Radiation Oncology | Admitting: Radiation Oncology

## 2021-07-22 ENCOUNTER — Other Ambulatory Visit: Payer: Self-pay

## 2021-07-22 DIAGNOSIS — C61 Malignant neoplasm of prostate: Secondary | ICD-10-CM | POA: Diagnosis not present

## 2021-07-22 DIAGNOSIS — Z510 Encounter for antineoplastic radiation therapy: Secondary | ICD-10-CM | POA: Diagnosis not present

## 2021-07-23 ENCOUNTER — Ambulatory Visit
Admission: RE | Admit: 2021-07-23 | Discharge: 2021-07-23 | Disposition: A | Discharge status: Home / Self Care | Payer: Medicare Other | Source: Ambulatory Visit | Attending: Radiation Oncology | Admitting: Radiation Oncology

## 2021-07-23 DIAGNOSIS — C61 Malignant neoplasm of prostate: Secondary | ICD-10-CM | POA: Diagnosis not present

## 2021-07-23 DIAGNOSIS — Z510 Encounter for antineoplastic radiation therapy: Secondary | ICD-10-CM | POA: Diagnosis not present

## 2021-07-24 ENCOUNTER — Other Ambulatory Visit: Payer: Self-pay

## 2021-07-24 ENCOUNTER — Ambulatory Visit
Admission: RE | Admit: 2021-07-24 | Discharge: 2021-07-24 | Disposition: A | Discharge status: Home / Self Care | Payer: Medicare Other | Source: Ambulatory Visit | Attending: Radiation Oncology | Admitting: Radiation Oncology

## 2021-07-24 DIAGNOSIS — C61 Malignant neoplasm of prostate: Secondary | ICD-10-CM | POA: Diagnosis not present

## 2021-07-24 DIAGNOSIS — Z510 Encounter for antineoplastic radiation therapy: Secondary | ICD-10-CM | POA: Diagnosis not present

## 2021-07-25 ENCOUNTER — Ambulatory Visit
Admission: RE | Admit: 2021-07-25 | Discharge: 2021-07-25 | Disposition: A | Discharge status: Home / Self Care | Payer: Medicare Other | Source: Ambulatory Visit | Attending: Radiation Oncology | Admitting: Radiation Oncology

## 2021-07-25 DIAGNOSIS — C61 Malignant neoplasm of prostate: Secondary | ICD-10-CM | POA: Diagnosis not present

## 2021-07-25 DIAGNOSIS — Z510 Encounter for antineoplastic radiation therapy: Secondary | ICD-10-CM | POA: Diagnosis not present

## 2021-07-26 ENCOUNTER — Ambulatory Visit
Admission: RE | Admit: 2021-07-26 | Discharge: 2021-07-26 | Disposition: A | Discharge status: Home / Self Care | Payer: Medicare Other | Source: Ambulatory Visit | Attending: Radiation Oncology | Admitting: Radiation Oncology

## 2021-07-26 DIAGNOSIS — C61 Malignant neoplasm of prostate: Secondary | ICD-10-CM | POA: Diagnosis not present

## 2021-07-26 DIAGNOSIS — Z510 Encounter for antineoplastic radiation therapy: Secondary | ICD-10-CM | POA: Diagnosis not present

## 2021-07-29 ENCOUNTER — Other Ambulatory Visit: Payer: Self-pay

## 2021-07-29 ENCOUNTER — Ambulatory Visit
Admission: RE | Admit: 2021-07-29 | Discharge: 2021-07-29 | Disposition: A | Discharge status: Home / Self Care | Payer: Medicare Other | Source: Ambulatory Visit | Attending: Radiation Oncology | Admitting: Radiation Oncology

## 2021-07-29 DIAGNOSIS — C61 Malignant neoplasm of prostate: Secondary | ICD-10-CM | POA: Diagnosis not present

## 2021-07-29 DIAGNOSIS — Z510 Encounter for antineoplastic radiation therapy: Secondary | ICD-10-CM | POA: Diagnosis not present

## 2021-07-30 ENCOUNTER — Ambulatory Visit
Admission: RE | Admit: 2021-07-30 | Discharge: 2021-07-30 | Disposition: A | Discharge status: Home / Self Care | Payer: Medicare Other | Source: Ambulatory Visit | Attending: Radiation Oncology | Admitting: Radiation Oncology

## 2021-07-30 ENCOUNTER — Other Ambulatory Visit: Payer: Self-pay

## 2021-07-30 DIAGNOSIS — Z510 Encounter for antineoplastic radiation therapy: Secondary | ICD-10-CM | POA: Diagnosis not present

## 2021-07-30 DIAGNOSIS — C61 Malignant neoplasm of prostate: Secondary | ICD-10-CM | POA: Diagnosis not present

## 2021-07-31 ENCOUNTER — Ambulatory Visit
Admission: RE | Admit: 2021-07-31 | Discharge: 2021-07-31 | Disposition: A | Discharge status: Home / Self Care | Payer: Medicare Other | Source: Ambulatory Visit | Attending: Radiation Oncology | Admitting: Radiation Oncology

## 2021-07-31 DIAGNOSIS — C61 Malignant neoplasm of prostate: Secondary | ICD-10-CM | POA: Diagnosis not present

## 2021-07-31 DIAGNOSIS — Z510 Encounter for antineoplastic radiation therapy: Secondary | ICD-10-CM | POA: Diagnosis not present

## 2021-08-01 ENCOUNTER — Other Ambulatory Visit: Payer: Self-pay

## 2021-08-01 ENCOUNTER — Encounter: Payer: Self-pay | Admitting: Urology

## 2021-08-01 ENCOUNTER — Ambulatory Visit
Admission: RE | Admit: 2021-08-01 | Discharge: 2021-08-01 | Disposition: A | Discharge status: Home / Self Care | Payer: Medicare Other | Source: Ambulatory Visit | Attending: Radiation Oncology | Admitting: Radiation Oncology

## 2021-08-01 DIAGNOSIS — C61 Malignant neoplasm of prostate: Secondary | ICD-10-CM | POA: Diagnosis not present

## 2021-08-01 DIAGNOSIS — Z510 Encounter for antineoplastic radiation therapy: Secondary | ICD-10-CM | POA: Diagnosis not present

## 2021-08-07 ENCOUNTER — Other Ambulatory Visit (HOSPITAL_COMMUNITY): Payer: Self-pay

## 2021-08-07 MED ORDER — FAMOTIDINE 20 MG PO TABS
20.0000 mg | ORAL_TABLET | Freq: Every morning | ORAL | 4 refills | Status: DC
  Filled 2021-08-07: qty 90, 90d supply, fill #0
  Filled 2021-11-03: qty 90, 90d supply, fill #1
  Filled 2022-01-27: qty 90, 90d supply, fill #2
  Filled 2022-04-28: qty 50, 50d supply, fill #3
  Filled 2022-04-28: qty 90, 90d supply, fill #3
  Filled 2022-04-28: qty 40, 40d supply, fill #3
  Filled 2022-07-25: qty 90, 90d supply, fill #4

## 2021-08-07 MED ORDER — VALSARTAN-HYDROCHLOROTHIAZIDE 160-12.5 MG PO TABS
1.0000 | ORAL_TABLET | Freq: Every day | ORAL | 3 refills | Status: DC
  Filled 2021-08-07: qty 90, 90d supply, fill #0
  Filled 2021-11-03: qty 90, 90d supply, fill #1
  Filled 2022-01-27: qty 90, 90d supply, fill #2
  Filled 2022-04-28: qty 90, 90d supply, fill #3

## 2021-08-23 DIAGNOSIS — C61 Malignant neoplasm of prostate: Secondary | ICD-10-CM | POA: Diagnosis not present

## 2021-08-30 DIAGNOSIS — C61 Malignant neoplasm of prostate: Secondary | ICD-10-CM | POA: Diagnosis not present

## 2021-08-30 DIAGNOSIS — N5201 Erectile dysfunction due to arterial insufficiency: Secondary | ICD-10-CM | POA: Diagnosis not present

## 2021-09-03 ENCOUNTER — Encounter: Payer: Self-pay | Admitting: Urology

## 2021-09-03 NOTE — Progress Notes (Signed)
Telephone follow-up appointment. I verified patient identity and began nursing interview. Patient is doing well. No issues reported at this time. ? ?Meaningful use complete. ?I-PSS score of 1 (mild). ?No urinary management mediations at this time. ?Urology appointment October 11th, 2023- per patient. ? ?Patient reminded of his 10:30am-09/04/21 telephone appointment w/ Ashlyn Bruning PA-C. I left my extension [phone] in case patient needs anything. Patient verbalized understanding of information. ? ?Patient contact [phone] ?

## 2021-09-04 ENCOUNTER — Ambulatory Visit
Admission: RE | Admit: 2021-09-04 | Discharge: 2021-09-04 | Disposition: A | Discharge status: Home / Self Care | Payer: Medicare Other | Source: Ambulatory Visit | Attending: Urology | Admitting: Urology

## 2021-09-04 DIAGNOSIS — C61 Malignant neoplasm of prostate: Secondary | ICD-10-CM

## 2021-09-04 NOTE — Progress Notes (Signed)
?Radiation Oncology         (336) [phone] ?________________________________ ? ?Name: Dillon Arias MRN: [id number]  ?Date: 09/04/2021  DOB: May 03, 1951 ? ?Post Treatment Note ? ?CC: Josetta Huddle, MD  Raynelle Bring, MD ? ?Diagnosis:   71 y.o. gentleman with biochemically recurrent prostate cancer with a current PSA at 0.19, s/p UNS RALP with BPLND in September 2020 for pT3aN0, Gleason 4+4 adenocarcinoma of the prostate with a focal positive margin at the left posterior lateral mid gland. ? ?Interval Since Last Radiation:  5 weeks  ?06/11/21 - 08/01/21: ?1. The prostate fossa and pelvic lymph nodes were initially treated to 45 Gy in 25 fractions of 1.8 Gy  ?2. The prostate fossa only was boosted to 68.4 Gy with 13 additional fractions of 1.8 Gy  ? ?Narrative:  I spoke with the patient to conduct his routine scheduled 1 month follow up visit via telephone to spare the patient unnecessary potential exposure in the healthcare setting during the current COVID-19 pandemic.  The patient was notified in advance and gave permission to proceed with this visit format. ? ?He tolerated radiation treatment relatively well with only minor urinary irritation and modest fatigue.  He reported nocturia x2 and did have some loose stools/diarrhea as well as increased gas.                               ? ?On review of systems, the patient states that he is doing very well in general.  He denies any bothersome LUTS at this point and feels that his bowels are pretty much back to normal aside from some residual increased gas/bloating.  He specifically denies dysuria, gross hematuria, straining to void, incomplete bladder emptying, incontinence, abdominal pain, nausea, vomiting, diarrhea or constipation.  He reports a healthy appetite and is maintaining his weight.  He denies any significant fatigue and overall, is quite pleased with his progress to date. ? ?ALLERGIES:  is allergic to codeine, epinephrine, and terbinafine and  related. ? ?Meds: ?Current Outpatient Medications  ?Medication Sig Dispense Refill  ? Acetaminophen (TYLENOL 8 HOUR PO) Take 500 mg by mouth as needed.    ? aspirin 81 MG chewable tablet Chew 81 mg by mouth every other day.    ? atorvastatin (LIPITOR) 20 MG tablet Take 20 mg by mouth daily.     ? atorvastatin (LIPITOR) 20 MG tablet Take 1 tablet (20 mg total) by mouth daily. 90 tablet 3  ? Cholecalciferol (VITAMIN D) 50 MCG (2000 UT) tablet 1 tablet    ? famotidine (PEPCID) 20 MG tablet Take 20 mg by mouth daily.    ? famotidine (PEPCID) 20 MG tablet Take 1 tablet (20 mg total) by mouth in the morning. 90 tablet 4  ? fexofenadine (ALLEGRA) 180 MG tablet Take 180 mg by mouth daily.    ? fexofenadine (ALLEGRA) 180 MG tablet 1 tablet    ? fluticasone (FLONASE) 50 MCG/ACT nasal spray Place 2 sprays into both nostrils daily. (Patient not taking: Reported on 01/28/2019) 9.9 g 2  ? Multiple Vitamin (MULTIVITAMIN ADULT PO)     ? pantoprazole (PROTONIX) 40 MG tablet Take 40 mg by mouth every evening.     ? tadalafil (CIALIS) 5 MG tablet Take 1 tablet (5 mg total) by mouth daily. 30 tablet 11  ? traMADol (ULTRAM) 50 MG tablet Take 1-2 tablets (50-100 mg total) by mouth every 6 (six) hours as needed for moderate pain  or severe pain. 20 tablet 0  ? valsartan-hydrochlorothiazide (DIOVAN-HCT) 160-12.5 MG tablet Take 1 tablet by mouth daily.    ? valsartan-hydrochlorothiazide (DIOVAN-HCT) 160-12.5 MG tablet Take 1 tablet by mouth daily. 90 tablet 3  ? ?No current facility-administered medications for this encounter.  ? ? ?Physical Findings: ? vitals were not taken for this visit.  ?Pain Assessment ?Pain Score: 0-No pain/10 ?Unable to assess due to telephone follow-up visit format. ? ?Lab Findings: ?Lab Results  ?Component Value Date  ? WBC 8.6 02/03/2019  ? HGB 13.4 02/11/2019  ? HCT 41.5 02/11/2019  ? MCV 95.4 02/03/2019  ? PLT 256 02/03/2019  ? ? ? ?Radiographic Findings: ?No results found. ? ?Impression/Plan: ?1. 71 y.o.  gentleman with biochemically recurrent prostate cancer with a current PSA at 0.19, s/p UNS RALP with BPLND in September 2020 for pT3aN0, Gleason 4+4 adenocarcinoma of the prostate with a focal positive margin at the left posterior lateral mid gland. ? ?He will continue to follow up with urology for ongoing PSA determinations and has an appointment scheduled with Dr. Alinda Money in October 2023.  He had a recent follow-up visit with Dr. Alinda Money on 09/03/2021 and PSA was back down to undetectable, which he is thrilled with.  He understands what to expect with regards to PSA monitoring going forward. I will look forward to following his response to treatment via correspondence with urology, and would be happy to continue to participate in his care if clinically indicated. I talked to the patient about what to expect in the future and answered his questions to his stated satisfaction. I encouraged him to call or return to the office if he has any questions regarding his previous radiation or possible radiation side effects. He was comfortable with this plan and will follow up as needed.  ? ? ? ?Nicholos Johns, PA-C  ?

## 2021-09-04 NOTE — Progress Notes (Signed)
?  Radiation Oncology         [phone]) [phone] ?________________________________ ? ?Name: Dillon Arias MRN: [id number]  ?Date: 08/01/2021  DOB: [birth year] ? ?End of Treatment Note ? ?Diagnosis:   71 y.o. gentleman with biochemically recurrent prostate cancer with a current PSA at 0.19, s/p UNS RALP with BPLND in September 2020 for pT3aN0, Gleason 4+4 adenocarcinoma of the prostate with a focal positive margin at the left posterior lateral mid gland.    ? ?Indication for treatment:  Curative, Definitive Radiotherapy      ? ?Radiation treatment dates:   06/11/21 - 08/01/21 ? ?Site/dose:  ?1. The prostate fossa and pelvic lymph nodes were initially treated to 45 Gy in 25 fractions of 1.8 Gy  ?2. The prostate fossa only was boosted to 68.4 Gy with 13 additional fractions of 1.8 Gy  ? ?Beams/energy:  ?1. The prostate fossa  and pelvic lymph nodes were initially treated using VMAT intensity modulated radiotherapy delivering 6 megavolt photons. Image guidance was performed with CB-CT studies prior to each fraction. He was immobilized with a body fix lower extremity mold.  ?2. The prostate fossa only was boosted using VMAT intensity modulated radiotherapy delivering 6 megavolt photons. Image guidance was performed with CB-CT studies prior to each fraction. He was immobilized with a body fix lower extremity mold. ? ?Narrative: The patient tolerated radiation treatment relatively well with only minor urinary irritation and modest fatigue.  He reported nocturia x2 and did have some loose stools/diarrhea as well as increased gas.   ? ?Plan: The patient has completed radiation treatment. He will return to radiation oncology clinic for routine followup in one month. I advised him to call or return sooner if he has any questions or concerns related to his recovery or treatment. ?________________________________ ? ?Sheral Apley Tammi Klippel, M.D. ?  ?

## 2021-09-05 ENCOUNTER — Other Ambulatory Visit (HOSPITAL_COMMUNITY): Payer: Self-pay

## 2021-09-09 ENCOUNTER — Other Ambulatory Visit (HOSPITAL_COMMUNITY): Payer: Self-pay

## 2021-10-10 DIAGNOSIS — R300 Dysuria: Secondary | ICD-10-CM | POA: Diagnosis not present

## 2021-11-04 ENCOUNTER — Other Ambulatory Visit (HOSPITAL_COMMUNITY): Payer: Self-pay

## 2021-12-10 ENCOUNTER — Other Ambulatory Visit (HOSPITAL_COMMUNITY): Payer: Self-pay

## 2021-12-10 MED ORDER — PANTOPRAZOLE SODIUM 40 MG PO TBEC
40.0000 mg | DELAYED_RELEASE_TABLET | Freq: Every day | ORAL | 3 refills | Status: DC
  Filled 2021-12-10: qty 60, 84d supply, fill #0
  Filled 2022-04-28: qty 60, 84d supply, fill #1
  Filled 2022-07-25: qty 60, 84d supply, fill #2

## 2022-01-13 DIAGNOSIS — D485 Neoplasm of uncertain behavior of skin: Secondary | ICD-10-CM | POA: Diagnosis not present

## 2022-01-13 DIAGNOSIS — L578 Other skin changes due to chronic exposure to nonionizing radiation: Secondary | ICD-10-CM | POA: Diagnosis not present

## 2022-01-13 DIAGNOSIS — L570 Actinic keratosis: Secondary | ICD-10-CM | POA: Diagnosis not present

## 2022-01-13 DIAGNOSIS — L814 Other melanin hyperpigmentation: Secondary | ICD-10-CM | POA: Diagnosis not present

## 2022-01-25 DIAGNOSIS — G5702 Lesion of sciatic nerve, left lower limb: Secondary | ICD-10-CM | POA: Diagnosis not present

## 2022-01-28 ENCOUNTER — Other Ambulatory Visit (HOSPITAL_COMMUNITY): Payer: Self-pay

## 2022-01-31 DIAGNOSIS — G5702 Lesion of sciatic nerve, left lower limb: Secondary | ICD-10-CM | POA: Diagnosis not present

## 2022-03-05 DIAGNOSIS — C61 Malignant neoplasm of prostate: Secondary | ICD-10-CM | POA: Diagnosis not present

## 2022-03-12 DIAGNOSIS — C61 Malignant neoplasm of prostate: Secondary | ICD-10-CM | POA: Diagnosis not present

## 2022-04-03 DIAGNOSIS — D485 Neoplasm of uncertain behavior of skin: Secondary | ICD-10-CM | POA: Diagnosis not present

## 2022-04-28 ENCOUNTER — Other Ambulatory Visit (HOSPITAL_COMMUNITY): Payer: Self-pay

## 2022-07-17 DIAGNOSIS — L918 Other hypertrophic disorders of the skin: Secondary | ICD-10-CM | POA: Diagnosis not present

## 2022-07-17 DIAGNOSIS — L814 Other melanin hyperpigmentation: Secondary | ICD-10-CM | POA: Diagnosis not present

## 2022-07-17 DIAGNOSIS — D225 Melanocytic nevi of trunk: Secondary | ICD-10-CM | POA: Diagnosis not present

## 2022-07-17 DIAGNOSIS — L578 Other skin changes due to chronic exposure to nonionizing radiation: Secondary | ICD-10-CM | POA: Diagnosis not present

## 2022-07-17 DIAGNOSIS — L820 Inflamed seborrheic keratosis: Secondary | ICD-10-CM | POA: Diagnosis not present

## 2022-07-17 DIAGNOSIS — L570 Actinic keratosis: Secondary | ICD-10-CM | POA: Diagnosis not present

## 2022-07-25 ENCOUNTER — Other Ambulatory Visit (HOSPITAL_COMMUNITY): Payer: Self-pay

## 2022-07-25 ENCOUNTER — Other Ambulatory Visit: Payer: Self-pay

## 2022-07-28 ENCOUNTER — Other Ambulatory Visit (HOSPITAL_COMMUNITY): Payer: Self-pay

## 2022-07-29 ENCOUNTER — Other Ambulatory Visit (HOSPITAL_COMMUNITY): Payer: Self-pay

## 2022-07-29 MED ORDER — VALSARTAN-HYDROCHLOROTHIAZIDE 160-12.5 MG PO TABS
1.0000 | ORAL_TABLET | Freq: Every day | ORAL | 3 refills | Status: DC
  Filled 2022-07-29: qty 90, 90d supply, fill #0
  Filled 2022-10-18: qty 90, 90d supply, fill #1
  Filled 2023-01-21: qty 90, 90d supply, fill #2
  Filled 2023-03-09: qty 90, 90d supply, fill #3

## 2022-07-31 ENCOUNTER — Other Ambulatory Visit (HOSPITAL_COMMUNITY): Payer: Self-pay

## 2022-07-31 MED ORDER — ATORVASTATIN CALCIUM 20 MG PO TABS
20.0000 mg | ORAL_TABLET | Freq: Every day | ORAL | 3 refills | Status: DC
  Filled 2022-07-31: qty 90, 90d supply, fill #0
  Filled 2022-10-18: qty 90, 90d supply, fill #1
  Filled 2023-01-21: qty 90, 90d supply, fill #2
  Filled 2023-03-09 – 2023-04-21 (×2): qty 90, 90d supply, fill #3

## 2022-08-06 DIAGNOSIS — Z1331 Encounter for screening for depression: Secondary | ICD-10-CM | POA: Diagnosis not present

## 2022-08-06 DIAGNOSIS — E1165 Type 2 diabetes mellitus with hyperglycemia: Secondary | ICD-10-CM | POA: Diagnosis not present

## 2022-08-06 DIAGNOSIS — I10 Essential (primary) hypertension: Secondary | ICD-10-CM | POA: Diagnosis not present

## 2022-08-06 DIAGNOSIS — E559 Vitamin D deficiency, unspecified: Secondary | ICD-10-CM | POA: Diagnosis not present

## 2022-08-06 DIAGNOSIS — Z79899 Other long term (current) drug therapy: Secondary | ICD-10-CM | POA: Diagnosis not present

## 2022-08-06 DIAGNOSIS — Z0000 Encounter for general adult medical examination without abnormal findings: Secondary | ICD-10-CM | POA: Diagnosis not present

## 2022-08-06 DIAGNOSIS — K219 Gastro-esophageal reflux disease without esophagitis: Secondary | ICD-10-CM | POA: Diagnosis not present

## 2022-08-06 DIAGNOSIS — E782 Mixed hyperlipidemia: Secondary | ICD-10-CM | POA: Diagnosis not present

## 2022-09-10 DIAGNOSIS — K080 Exfoliation of teeth due to systemic causes: Secondary | ICD-10-CM | POA: Diagnosis not present

## 2022-10-18 ENCOUNTER — Other Ambulatory Visit (HOSPITAL_COMMUNITY): Payer: Self-pay

## 2022-10-21 ENCOUNTER — Other Ambulatory Visit (HOSPITAL_COMMUNITY): Payer: Self-pay

## 2022-10-21 MED ORDER — FAMOTIDINE 20 MG PO TABS
20.0000 mg | ORAL_TABLET | Freq: Every morning | ORAL | 3 refills | Status: DC
  Filled 2022-10-21: qty 90, 90d supply, fill #0
  Filled 2023-01-20: qty 90, 90d supply, fill #1
  Filled 2023-03-09 – 2023-04-21 (×2): qty 90, 90d supply, fill #2
  Filled 2023-07-15: qty 90, 90d supply, fill #3

## 2022-11-05 DIAGNOSIS — C61 Malignant neoplasm of prostate: Secondary | ICD-10-CM | POA: Diagnosis not present

## 2022-11-06 ENCOUNTER — Other Ambulatory Visit (HOSPITAL_COMMUNITY): Payer: Self-pay

## 2022-11-06 DIAGNOSIS — E1165 Type 2 diabetes mellitus with hyperglycemia: Secondary | ICD-10-CM | POA: Diagnosis not present

## 2022-11-06 DIAGNOSIS — C61 Malignant neoplasm of prostate: Secondary | ICD-10-CM | POA: Diagnosis not present

## 2022-11-06 DIAGNOSIS — M25552 Pain in left hip: Secondary | ICD-10-CM | POA: Diagnosis not present

## 2022-11-06 DIAGNOSIS — M25562 Pain in left knee: Secondary | ICD-10-CM | POA: Diagnosis not present

## 2022-11-06 MED ORDER — PREDNISONE 10 MG PO TABS
ORAL_TABLET | ORAL | 0 refills | Status: DC
  Filled 2022-11-06: qty 15, 5d supply, fill #0

## 2022-11-12 DIAGNOSIS — C61 Malignant neoplasm of prostate: Secondary | ICD-10-CM | POA: Diagnosis not present

## 2022-11-26 ENCOUNTER — Encounter: Payer: Self-pay | Admitting: Physical Therapy

## 2022-11-26 ENCOUNTER — Other Ambulatory Visit: Payer: Self-pay

## 2022-11-26 ENCOUNTER — Ambulatory Visit: Payer: Medicare Other | Attending: Orthopedic Surgery | Admitting: Physical Therapy

## 2022-11-26 DIAGNOSIS — M25552 Pain in left hip: Secondary | ICD-10-CM | POA: Diagnosis not present

## 2022-11-26 DIAGNOSIS — R2689 Other abnormalities of gait and mobility: Secondary | ICD-10-CM | POA: Diagnosis not present

## 2022-11-26 DIAGNOSIS — M6281 Muscle weakness (generalized): Secondary | ICD-10-CM | POA: Diagnosis not present

## 2022-11-26 DIAGNOSIS — M25562 Pain in left knee: Secondary | ICD-10-CM | POA: Diagnosis not present

## 2022-11-26 NOTE — Therapy (Signed)
OUTPATIENT PHYSICAL THERAPY LOWER EXTREMITY EVALUATION   Patient Name: Dillon Arias MRN: [id number] DOB:07-18-1950, 72 y.o., male Today's Date: 11/26/2022  END OF SESSION:  PT End of Session - 11/26/22 0846     Visit Number 1    Number of Visits 8    Date for PT Re-Evaluation 01/21/23    Authorization Type BCBS    PT Start Time [phone]    PT Stop Time 0930    PT Time Calculation (min) 44 min    Activity Tolerance Patient tolerated treatment well    Behavior During Therapy [facility] for tasks assessed/performed             Past Medical History:  Diagnosis Date   GERD (gastroesophageal reflux disease)    H/O renal calculi    History of kidney stones 2008   Hyperlipidemia    Hypertension    Past Surgical History:  Procedure Laterality Date   COLONOSCOPY WITH PROPOFOL N/A 01/22/2015   Procedure: COLONOSCOPY WITH PROPOFOL;  Surgeon: Charolett Bumpers, MD;  Location: [facility];  Service: Endoscopy;  Laterality: N/A;   EXTRACORPOREAL SHOCK WAVE LITHOTRIPSY     HERNIA REPAIR     Left   KIDNEY STONE SURGERY     LYMPHADENECTOMY Bilateral 02/10/2019   Procedure: LYMPHADENECTOMY, PELVIC;  Surgeon: Heloise Purpura, MD;  Location: [facility];  Service: Urology;  Laterality: Bilateral;   PILONIDAL CYST EXCISION     PTOSIS REPAIR     ROBOT ASSISTED LAPAROSCOPIC RADICAL PROSTATECTOMY N/A 02/10/2019   Procedure: XI ROBOTIC ASSISTED LAPAROSCOPIC RADICAL PROSTATECTOMY LEVEL 2;  Surgeon: Heloise Purpura, MD;  Location: [facility];  Service: Urology;  Laterality: N/A;  ONLY NEEDS 210 MIN FOR ALL PROCEDURES   Patient Active Problem List   Diagnosis Date Noted   Biochemically recurrent malignant neoplasm of prostate (HCC) 05/29/2021   Other shoulder lesions, unspecified shoulder 05/29/2021   Plantar fascial fibromatosis 05/29/2021   Sciatica 05/29/2021   Throat irritation 05/29/2021   Tinea unguium 05/29/2021   Vitamin D deficiency 05/29/2021   Morbid obesity (HCC) 05/29/2021   Mixed  hyperlipidemia 05/29/2021   Hyperglycemia 05/29/2021   Gastroesophageal reflux disease 05/29/2021   Eustachian tube dysfunction 05/29/2021   Essential hypertension 05/29/2021   Erectile dysfunction due to arterial insufficiency 05/29/2021   Elevated PSA 05/29/2021   Cough 05/29/2021   Contusion of shoulder region 05/29/2021   Congenital malformation 05/29/2021   Annual physical exam 05/29/2021   Coronary artery calcification 05/29/2021   Aortic calcification (HCC) 05/29/2021   Sigmoid diverticulosis 05/29/2021   Ventral hernia without obstruction or gangrene 05/29/2021   Renal calculi 05/29/2021   Prostate cancer (HCC) 02/10/2019   Laryngopharyngeal reflux (LPR) 01/03/2016   Thyroglossal duct cyst 11/13/2015   Thyroglossal duct infection 11/07/2015   Acute epiglottiditis 11/06/2015   Epiglottiditis 11/06/2015    PCP: Marden Noble, MD (Inactive)  REFERRING PROVIDER: Rosalene Billings, MD  REFERRING DIAG:  [phone] (ICD-10-CM) - Pain in left knee  M25.552 (ICD-10-CM) - Pain in left hip    THERAPY DIAG:  Pain in left hip  Acute pain of left knee  Muscle weakness (generalized)  Other abnormalities of gait and mobility  Rationale for Evaluation and Treatment: Rehabilitation  ONSET DATE: 1.5 years ago  SUBJECTIVE:   SUBJECTIVE STATEMENT: "[name]." Pt states he was put on an extended prednisone regime which cleared it up when it first occurred 1.5 years ago; however, in the last month it's bothering him  again. Pt states occasionally it will radiate to the front of his quad. Pt notes if he's stationary for prolonged periods his knee will also bother him. 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   PERTINENT HISTORY: N/a  PAIN:  Are you having pain? Yes: NPRS scale: 1-2 currently, at worst 6 to 7/10 Pain location: L quad/lateral knee mostly now; normally posterior  hip Pain description: tight; lateral dull ache in hip Aggravating factors: prolonged sitting (~1 hour), lifting >30 lbs Relieving factors: advil/tylenol  PRECAUTIONS: None  WEIGHT BEARING RESTRICTIONS: No  FALLS:  Has patient fallen in last 6 months? No  LIVING ENVIRONMENT: Lives with: lives with their spouse Lives in: House/apartment Stairs: No Has following equipment at home: None  OCCUPATION: Engineer, water  PLOF: Independent  PATIENT GOALS: Improve pain with mobility  NEXT MD VISIT: Will be seeing ortho  OBJECTIVE:   DIAGNOSTIC FINDINGS: Nothing recent on file  PATIENT SURVEYS:  LEFS 67/80 = 83.8%  COGNITION: Overall cognitive status: Within functional limits for tasks assessed     SENSATION: WFL  EDEMA:  None  MUSCLE LENGTH: Hamstrings: Right 80 deg; Left 80 deg Thomas test: Right 0 deg; Left 0 deg  POSTURE: No Significant postural limitations  PALPATION: L>R glute med tenderness  LOWER EXTREMITY ROM:  Active ROM Right eval Left eval  Hip flexion    Hip extension    Hip abduction    Hip adduction    Hip internal rotation    Hip external rotation    Knee flexion    Knee extension    Ankle dorsiflexion    Ankle plantarflexion    Ankle inversion    Ankle eversion     (Blank rows = not tested)  LOWER EXTREMITY MMT:  MMT Right eval Left eval  Hip flexion 4+ 4  Hip extension 4+ 4  Hip abduction 4 3+  Hip adduction    Hip internal rotation 4 3+  Hip external rotation 4 4  Knee flexion 5 5  Knee extension 5 5  Ankle dorsiflexion    Ankle plantarflexion    Ankle inversion    Ankle eversion     (Blank rows = not tested)  LOWER EXTREMITY SPECIAL TESTS:  Hip special tests: Luisa Hart (FABER) test: positive , Thomas test: negative, and Ely's test: negative Slump test (+) on L  FUNCTIONAL TESTS:  Did not assess  GAIT: Distance walked: 100 Assistive device utilized: None Level of assistance: Complete Independence Comments:  WFL   TODAY'S TREATMENT:                                                                                                                              DATE: 11/26/22 See HEP below    PATIENT EDUCATION:  Education details: Exam findings, POC, initial HEP Person educated: Patient Education method: Explanation, Demonstration, and Handouts Education comprehension: verbalized understanding, returned demonstration, and needs  further education  HOME EXERCISE PROGRAM: Access Code: [email or web address] URL: [email or web address] Date: 11/26/2022 Prepared by: Vernon Prey April Kirstie Peri  Exercises - Clamshell with Resistance  - 1 x daily - 7 x weekly - 2-3 sets - 10 reps - Supine ITB Stretch with Strap  - 1 x daily - 7 x weekly - 2 sets - 30 sec hold - Seated Piriformis Stretch  - 1 x daily - 7 x weekly - 2 sets - 30 sec hold - Supine Quadriceps Stretch with Strap on Table  - 1 x daily - 7 x weekly - 2 sets - 30 sec hold  ASSESSMENT:  CLINICAL IMPRESSION: Patient is a 72 y.o. M who was seen today for physical therapy evaluation and treatment for L posterior hip pain radiating to front of knee. Assessment significant for weak and tender glute med on L likely pulling on to L ITB causing his radiating pain to the knee. Pt will benefit from PT to address these deficits for return to all normal function.   OBJECTIVE IMPAIRMENTS: decreased activity tolerance, decreased endurance, decreased mobility, decreased ROM, decreased strength, increased fascial restrictions, increased muscle spasms, improper body mechanics, postural dysfunction, and pain.   ACTIVITY LIMITATIONS: sitting, standing, and transfers  PARTICIPATION LIMITATIONS: driving and occupation  PERSONAL FACTORS: Age, Fitness, Past/current experiences, Profession, and Time since onset of injury/illness/exacerbation are also affecting patient's functional outcome.   REHAB POTENTIAL: Good  CLINICAL DECISION MAKING:  Stable/uncomplicated  EVALUATION COMPLEXITY: Low   GOALS: Goals reviewed with patient? Yes  SHORT TERM GOALS: Target date: 12/24/2022  Pt will be ind with initial HEP Baseline: Goal status: INITIAL  LONG TERM GOALS: Target date: 01/21/2023   Pt will be ind with management and progression of HEP Baseline:  Goal status: INITIAL  2.  Pt will report decrease in pain by >/=90% Baseline:  Goal status: INITIAL  3.  Pt will demo improved functional LE by squatting/lifting at least 30# with </=2/10 pain Baseline:  Goal status: INITIAL  4.  Pt will have improved LEFS to >/=93% to demo MCID Baseline: 83.8% Goal status: INITIAL  PLAN:  PT FREQUENCY: 1x/week  PT DURATION: 8 weeks  PLANNED INTERVENTIONS: Therapeutic exercises, Therapeutic activity, Neuromuscular re-education, Balance training, Gait training, Patient/Family education, Self Care, Joint mobilization, Stair training, Dry Needling, Spinal manipulation, Spinal mobilization, Cryotherapy, Moist heat, Taping, Ionotophoresis 4mg /ml Dexamethasone, Manual therapy, and Re-evaluation  PLAN FOR NEXT SESSION: Assess response to HEP. Continue stretching glutes/piriformis, quads/hip flexors. Manual work or TPDN as indicated. Strengthen glute med.    Mac Dowdell April Ma L Dontai Pember, PT 11/26/2022, 10:39 AM

## 2022-12-03 ENCOUNTER — Ambulatory Visit: Payer: Medicare Other | Attending: Orthopedic Surgery | Admitting: Physical Therapy

## 2022-12-03 DIAGNOSIS — M25562 Pain in left knee: Secondary | ICD-10-CM | POA: Diagnosis not present

## 2022-12-03 DIAGNOSIS — M6281 Muscle weakness (generalized): Secondary | ICD-10-CM | POA: Diagnosis not present

## 2022-12-03 DIAGNOSIS — M25552 Pain in left hip: Secondary | ICD-10-CM | POA: Diagnosis not present

## 2022-12-03 DIAGNOSIS — R2689 Other abnormalities of gait and mobility: Secondary | ICD-10-CM | POA: Insufficient documentation

## 2022-12-03 NOTE — Therapy (Signed)
OUTPATIENT PHYSICAL THERAPY LOWER EXTREMITY TREATMENT   Patient Name: Dillon Arias MRN: [id number] DOB:1950-09-01, 72 y.o., male Today's Date: 12/03/2022  END OF SESSION:  PT End of Session - 12/03/22 0805     Visit Number 2    Number of Visits 8    Date for PT Re-Evaluation 01/21/23    Authorization Type BCBS    PT Start Time 0805    PT Stop Time 0845    PT Time Calculation (min) 40 min    Activity Tolerance Patient tolerated treatment well    Behavior During Therapy [facility] for tasks assessed/performed              Past Medical History:  Diagnosis Date   GERD (gastroesophageal reflux disease)    H/O renal calculi    History of kidney stones 2008   Hyperlipidemia    Hypertension    Past Surgical History:  Procedure Laterality Date   COLONOSCOPY WITH PROPOFOL N/A 01/22/2015   Procedure: COLONOSCOPY WITH PROPOFOL;  Surgeon: Charolett Bumpers, MD;  Location: [facility];  Service: Endoscopy;  Laterality: N/A;   EXTRACORPOREAL SHOCK WAVE LITHOTRIPSY     HERNIA REPAIR     Left   KIDNEY STONE SURGERY     LYMPHADENECTOMY Bilateral 02/10/2019   Procedure: LYMPHADENECTOMY, PELVIC;  Surgeon: Heloise Purpura, MD;  Location: [facility];  Service: Urology;  Laterality: Bilateral;   PILONIDAL CYST EXCISION     PTOSIS REPAIR     ROBOT ASSISTED LAPAROSCOPIC RADICAL PROSTATECTOMY N/A 02/10/2019   Procedure: XI ROBOTIC ASSISTED LAPAROSCOPIC RADICAL PROSTATECTOMY LEVEL 2;  Surgeon: Heloise Purpura, MD;  Location: [facility];  Service: Urology;  Laterality: N/A;  ONLY NEEDS 210 MIN FOR ALL PROCEDURES   Patient Active Problem List   Diagnosis Date Noted   Biochemically recurrent malignant neoplasm of prostate (HCC) 05/29/2021   Other shoulder lesions, unspecified shoulder 05/29/2021   Plantar fascial fibromatosis 05/29/2021   Sciatica 05/29/2021   Throat irritation 05/29/2021   Tinea unguium 05/29/2021   Vitamin D deficiency 05/29/2021   Morbid obesity (HCC) 05/29/2021   Mixed  hyperlipidemia 05/29/2021   Hyperglycemia 05/29/2021   Gastroesophageal reflux disease 05/29/2021   Eustachian tube dysfunction 05/29/2021   Essential hypertension 05/29/2021   Erectile dysfunction due to arterial insufficiency 05/29/2021   Elevated PSA 05/29/2021   Cough 05/29/2021   Contusion of shoulder region 05/29/2021   Congenital malformation 05/29/2021   Annual physical exam 05/29/2021   Coronary artery calcification 05/29/2021   Aortic calcification (HCC) 05/29/2021   Sigmoid diverticulosis 05/29/2021   Ventral hernia without obstruction or gangrene 05/29/2021   Renal calculi 05/29/2021   Prostate cancer (HCC) 02/10/2019   Laryngopharyngeal reflux (LPR) 01/03/2016   Thyroglossal duct cyst 11/13/2015   Thyroglossal duct infection 11/07/2015   Acute epiglottiditis 11/06/2015   Epiglottiditis 11/06/2015    PCP: Marden Noble, MD (Inactive)  REFERRING PROVIDER: Rosalene Billings, MD  REFERRING DIAG:  [phone] (ICD-10-CM) - Pain in left knee  M25.552 (ICD-10-CM) - Pain in left hip    THERAPY DIAG:  No diagnosis found.  Rationale for Evaluation and Treatment: Rehabilitation  ONSET DATE: 1.5 years ago  SUBJECTIVE:   SUBJECTIVE STATEMENT: Pt states he's been working a lot at home -- did a lot of yard work and Hydrologist. Pt states some hip tenderness this morning. Pt reports in general his leg has been feeling better. Pt reports compliance to exercises.   PERTINENT HISTORY: N/a From eval: "I've been having problems with my piriformis on  the left." Pt states he was put on an extended prednisone regime which cleared it up when it first occurred 1.5 years ago; however, in the last month it's bothering him again. Pt states occasionally it will radiate to the front of his quad. Pt notes if he's stationary for prolonged periods his knee will also bother him. 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   PAIN:  Are  you having pain? Yes: NPRS scale: 1-2 currently, at worst 6 to 7/10 Pain location: L quad/lateral knee mostly now; normally posterior hip Pain description: tight; lateral dull ache in hip Aggravating factors: prolonged sitting (~1 hour), lifting >30 lbs Relieving factors: advil/tylenol  PRECAUTIONS: None  WEIGHT BEARING RESTRICTIONS: No  FALLS:  Has patient fallen in last 6 months? No  LIVING ENVIRONMENT: Lives with: lives with their spouse Lives in: House/apartment Stairs: No Has following equipment at home: None  OCCUPATION: Engineer, water  PLOF: Independent  PATIENT GOALS: Improve pain with mobility  NEXT MD VISIT: Seeing Dr. Magnus Ivan mid July  OBJECTIVE:   DIAGNOSTIC FINDINGS: Nothing recent on file  PATIENT SURVEYS:  LEFS 67/80 = 83.8%  MUSCLE LENGTH: Hamstrings: Right 80 deg; Left 80 deg Thomas test: Right 0 deg; Left 0 deg  PALPATION: L>R glute med tenderness  LOWER EXTREMITY ROM:  Active ROM Right eval Left eval  Hip flexion    Hip extension    Hip abduction    Hip adduction    Hip internal rotation    Hip external rotation    Knee flexion    Knee extension    Ankle dorsiflexion    Ankle plantarflexion    Ankle inversion    Ankle eversion     (Blank rows = not tested)  LOWER EXTREMITY MMT:  MMT Right eval Left eval  Hip flexion 4+ 4  Hip extension 4+ 4  Hip abduction 4 3+  Hip adduction    Hip internal rotation 4 3+  Hip external rotation 4 4  Knee flexion 5 5  Knee extension 5 5  Ankle dorsiflexion    Ankle plantarflexion    Ankle inversion    Ankle eversion     (Blank rows = not tested)  LOWER EXTREMITY SPECIAL TESTS:  Hip special tests: Luisa Hart (FABER) test: positive , Thomas test: negative, and Ely's test: negative Slump test (+) on L  FUNCTIONAL TESTS:  Did not assess  GAIT: Distance walked: 100 Assistive device utilized: None Level of assistance: Complete Independence Comments: WFL                                                                                                                      [facility] Adult PT Treatment:  DATE: 12/03/22 Therapeutic Exercise: Nustep L6 x 5 min LEs Seated piriformis stretch 2 x 30 sec Supine hip flexor + quad stretch with strap 2x30 sec Supine ITB stretch 2 x 30 sec Supine bridge 2x10x5 sec Sidelying clamshell red TB 3x10 Standing captain morgan 10x5 sec Modified woodpecker 2x10 Self Care: Self massage/TPR using ball for glute   11/26/22 See HEP below    PATIENT EDUCATION:  Education details: Exam findings, POC, initial HEP Person educated: Patient Education method: Explanation, Demonstration, and Handouts Education comprehension: verbalized understanding, returned demonstration, and needs further education  HOME EXERCISE PROGRAM: Access Code: [email or web address] URL: [email or web address] Date: 11/26/2022 Prepared by: Vernon Prey April Kirstie Peri  Exercises - Clamshell with Resistance  - 1 x daily - 7 x weekly - 2-3 sets - 10 reps - Supine ITB Stretch with Strap  - 1 x daily - 7 x weekly - 2 sets - 30 sec hold - Seated Piriformis Stretch  - 1 x daily - 7 x weekly - 2 sets - 30 sec hold - Supine Quadriceps Stretch with Strap on Table  - 1 x daily - 7 x weekly - 2 sets - 30 sec hold  ASSESSMENT:  CLINICAL IMPRESSION: Reviewed HEP -- found that pt has been using a strap with too much give.  Continued glute strengthening to pt tolerance. Discussed self massage through glutes/piriformis.   From eval: Patient is a 72 y.o. M who was seen today for physical therapy evaluation and treatment for L posterior hip pain radiating to front of knee. Assessment significant for weak and tender glute med on L likely pulling on to L ITB causing his radiating pain to the knee. Pt will benefit from PT to address these deficits for return to all normal function.   OBJECTIVE IMPAIRMENTS: decreased activity tolerance,  decreased endurance, decreased mobility, decreased ROM, decreased strength, increased fascial restrictions, increased muscle spasms, improper body mechanics, postural dysfunction, and pain.    GOALS: Goals reviewed with patient? Yes  SHORT TERM GOALS: Target date: 12/24/2022  Pt will be ind with initial HEP Baseline: Goal status: INITIAL  LONG TERM GOALS: Target date: 01/21/2023   Pt will be ind with management and progression of HEP Baseline:  Goal status: INITIAL  2.  Pt will report decrease in pain by >/=90% Baseline:  Goal status: INITIAL  3.  Pt will demo improved functional LE by squatting/lifting at least 30# with </=2/10 pain Baseline:  Goal status: INITIAL  4.  Pt will have improved LEFS to >/=93% to demo MCID Baseline: 83.8% Goal status: INITIAL  PLAN:  PT FREQUENCY: 1x/week  PT DURATION: 8 weeks  PLANNED INTERVENTIONS: Therapeutic exercises, Therapeutic activity, Neuromuscular re-education, Balance training, Gait training, Patient/Family education, Self Care, Joint mobilization, Stair training, Dry Needling, Spinal manipulation, Spinal mobilization, Cryotherapy, Moist heat, Taping, Ionotophoresis 4mg /ml Dexamethasone, Manual therapy, and Re-evaluation  PLAN FOR NEXT SESSION: Assess response to HEP. Continue stretching glutes/piriformis, quads/hip flexors. Manual work or TPDN as indicated. Strengthen glute med.    Lyndi Holbein April Ma L Shanyiah Conde, PT 12/03/2022, 8:06 AM

## 2022-12-10 ENCOUNTER — Encounter: Payer: Self-pay | Admitting: Physical Therapy

## 2022-12-10 ENCOUNTER — Ambulatory Visit: Payer: Medicare Other | Admitting: Physical Therapy

## 2022-12-10 DIAGNOSIS — M25552 Pain in left hip: Secondary | ICD-10-CM | POA: Diagnosis not present

## 2022-12-10 DIAGNOSIS — R2689 Other abnormalities of gait and mobility: Secondary | ICD-10-CM | POA: Diagnosis not present

## 2022-12-10 DIAGNOSIS — M25562 Pain in left knee: Secondary | ICD-10-CM

## 2022-12-10 DIAGNOSIS — M6281 Muscle weakness (generalized): Secondary | ICD-10-CM

## 2022-12-10 NOTE — Therapy (Signed)
OUTPATIENT PHYSICAL THERAPY LOWER EXTREMITY TREATMENT   Patient Name: Dillon Arias MRN: [id number] DOB:03-22-1951, 73 y.o., male Today's Date: 12/10/2022  END OF SESSION:  PT End of Session - 12/10/22 0838     Visit Number 3    Number of Visits 8    Date for PT Re-Evaluation 01/21/23    Authorization Type BCBS    PT Start Time [phone]    PT Stop Time 0920    PT Time Calculation (min) 42 min    Activity Tolerance Patient tolerated treatment well    Behavior During Therapy [facility] for tasks assessed/performed               Past Medical History:  Diagnosis Date   GERD (gastroesophageal reflux disease)    H/O renal calculi    History of kidney stones 2008   Hyperlipidemia    Hypertension    Past Surgical History:  Procedure Laterality Date   COLONOSCOPY WITH PROPOFOL N/A 01/22/2015   Procedure: COLONOSCOPY WITH PROPOFOL;  Surgeon: Dillon Bumpers, MD;  Location: [facility];  Service: Endoscopy;  Laterality: N/A;   EXTRACORPOREAL SHOCK WAVE LITHOTRIPSY     HERNIA REPAIR     Left   KIDNEY STONE SURGERY     LYMPHADENECTOMY Bilateral 02/10/2019   Procedure: LYMPHADENECTOMY, PELVIC;  Surgeon: Dillon Purpura, MD;  Location: [facility];  Service: Urology;  Laterality: Bilateral;   PILONIDAL CYST EXCISION     PTOSIS REPAIR     ROBOT ASSISTED LAPAROSCOPIC RADICAL PROSTATECTOMY N/A 02/10/2019   Procedure: XI ROBOTIC ASSISTED LAPAROSCOPIC RADICAL PROSTATECTOMY LEVEL 2;  Surgeon: Dillon Purpura, MD;  Location: [facility];  Service: Urology;  Laterality: N/A;  ONLY NEEDS 210 MIN FOR ALL PROCEDURES   Patient Active Problem List   Diagnosis Date Noted   Biochemically recurrent malignant neoplasm of prostate (HCC) 05/29/2021   Other shoulder lesions, unspecified shoulder 05/29/2021   Plantar fascial fibromatosis 05/29/2021   Sciatica 05/29/2021   Throat irritation 05/29/2021   Tinea unguium 05/29/2021   Vitamin D deficiency 05/29/2021   Morbid obesity (HCC) 05/29/2021   Mixed  hyperlipidemia 05/29/2021   Hyperglycemia 05/29/2021   Gastroesophageal reflux disease 05/29/2021   Eustachian tube dysfunction 05/29/2021   Essential hypertension 05/29/2021   Erectile dysfunction due to arterial insufficiency 05/29/2021   Elevated PSA 05/29/2021   Cough 05/29/2021   Contusion of shoulder region 05/29/2021   Congenital malformation 05/29/2021   Annual physical exam 05/29/2021   Coronary artery calcification 05/29/2021   Aortic calcification (HCC) 05/29/2021   Sigmoid diverticulosis 05/29/2021   Ventral hernia without obstruction or gangrene 05/29/2021   Renal calculi 05/29/2021   Prostate cancer (HCC) 02/10/2019   Laryngopharyngeal reflux (LPR) 01/03/2016   Thyroglossal duct cyst 11/13/2015   Thyroglossal duct infection 11/07/2015   Acute epiglottiditis 11/06/2015   Epiglottiditis 11/06/2015    PCP: Dillon Noble, MD (Inactive)  REFERRING PROVIDER: Rosalene Billings, MD  REFERRING DIAG:  [phone] (ICD-10-CM) - Pain in left knee  M25.552 (ICD-10-CM) - Pain in left hip    THERAPY DIAG:  Pain in left hip  Acute pain of left knee  Muscle weakness (generalized)  Other abnormalities of gait and mobility  Rationale for Evaluation and Treatment: Rehabilitation  ONSET DATE: 1.5 years ago  SUBJECTIVE:   SUBJECTIVE STATEMENT: "I can tell the difference." Reports some soreness yesterday but thinks he might have just over done it.   PERTINENT HISTORY: N/a From eval: "[name]." Pt states  he was put on an extended prednisone regime which cleared it up when it first occurred 1.5 years ago; however, in the last month it's bothering him again. Pt states occasionally it will radiate to the front of his quad. Pt notes if he's stationary for prolonged periods his knee will also bother him. 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   PAIN:  Are you having pain?  Yes: NPRS scale: 0 currently, at worst 6 to 7/10 Pain location: L quad/lateral knee mostly now; normally posterior hip Pain description: tight; lateral dull ache in hip Aggravating factors: prolonged sitting (~1 hour), lifting >30 lbs Relieving factors: advil/tylenol  PRECAUTIONS: None  WEIGHT BEARING RESTRICTIONS: No  FALLS:  Has patient fallen in last 6 months? No  LIVING ENVIRONMENT: Lives with: lives with their spouse Lives in: House/apartment Stairs: No Has following equipment at home: None  OCCUPATION: Engineer, water  PLOF: Independent  PATIENT GOALS: Improve pain with mobility  NEXT MD VISIT: Seeing Dr. Magnus Arias mid July  OBJECTIVE:   DIAGNOSTIC FINDINGS: Nothing recent on file  PATIENT SURVEYS:  LEFS 67/80 = 83.8%  MUSCLE LENGTH: Hamstrings: Right 80 deg; Left 80 deg Thomas test: Right 0 deg; Left 0 deg  PALPATION: L>R glute med tenderness  LOWER EXTREMITY ROM:  Active ROM Right eval Left eval  Hip flexion    Hip extension    Hip abduction    Hip adduction    Hip internal rotation    Hip external rotation    Knee flexion    Knee extension    Ankle dorsiflexion    Ankle plantarflexion    Ankle inversion    Ankle eversion     (Blank rows = not tested)  LOWER EXTREMITY MMT:  MMT Right eval Left eval  Hip flexion 4+ 4  Hip extension 4+ 4  Hip abduction 4 3+  Hip adduction    Hip internal rotation 4 3+  Hip external rotation 4 4  Knee flexion 5 5  Knee extension 5 5  Ankle dorsiflexion    Ankle plantarflexion    Ankle inversion    Ankle eversion     (Blank rows = not tested)  LOWER EXTREMITY SPECIAL TESTS:  Hip special tests: Dillon Arias (FABER) test: positive , Thomas test: negative, and Ely's test: negative Slump test (+) on L  FUNCTIONAL TESTS:  Did not assess  GAIT: Distance walked: 100 Assistive device utilized: None Level of assistance: Complete Independence Comments: Dillon Arias  [facility] Adult PT Treatment:                                                 DATE: 12/10/22 Therapeutic Exercise: Recumbent bike L1 x 5 min Seated piriformis stretch 2x 30 sec Seated hip flexor stretch 2x30 sec Sidelying clamshell green TB 3x10 Prone hip ext with knee flexed 3x10 Supine SKTC x30 sec Captain morgan 10x5 sec Standing fire hydrant green TB 3x10 Modified woodpecker 2x10  [facility] Adult PT Treatment:                                                DATE: 12/03/22 Therapeutic Exercise: Nustep L6 x 5 min LEs Seated piriformis stretch 2 x 30 sec Supine hip flexor + quad stretch with strap 2x30 sec Supine ITB stretch 2 x 30 sec Supine bridge 2x10x5 sec Sidelying clamshell red TB 3x10 Standing captain morgan 10x5 sec Modified woodpecker 2x10 Self Care: Self massage/TPR using ball for glute   11/26/22 See HEP below    PATIENT EDUCATION:  Education details: Exam findings, POC, initial HEP Person educated: Patient Education method: Explanation, Demonstration, and Handouts Education comprehension: verbalized understanding, returned demonstration, and needs further education  HOME EXERCISE PROGRAM: Access Code: [email or web address] URL: [email or web address] Date: 11/26/2022 Prepared by: Vernon Prey April Kirstie Peri  Exercises - Clamshell with Resistance  - 1 x daily - 7 x weekly - 2-3 sets - 10 reps - Supine ITB Stretch with Strap  - 1 x daily - 7 x weekly - 2 sets - 30 sec hold - Seated Piriformis Stretch  - 1 x daily - 7 x weekly - 2 sets - 30 sec hold - Supine Quadriceps Stretch with Strap on Table  - 1 x daily - 7 x weekly - 2 sets - 30 sec hold  ASSESSMENT:  CLINICAL IMPRESSION: Pt continues to progress well with his exercises. No pain noted today. Able to tolerate green resistance band. Pt remains consistent with his exercises.   From eval: Patient is a 72 y.o. M who was seen today for physical  therapy evaluation and treatment for L posterior hip pain radiating to front of knee. Assessment significant for weak and tender glute med on L likely pulling on to L ITB causing his radiating pain to the knee. Pt will benefit from PT to address these deficits for return to all normal function.   OBJECTIVE IMPAIRMENTS: decreased activity tolerance, decreased endurance, decreased mobility, decreased ROM, decreased strength, increased fascial restrictions, increased muscle spasms, improper body mechanics, postural dysfunction, and pain.    GOALS: Goals reviewed with patient? Yes  SHORT TERM GOALS: Target date: 12/24/2022  Pt will be ind with initial HEP Baseline: Goal status: MET  LONG TERM GOALS: Target date: 01/21/2023   Pt will be ind with management and progression of HEP Baseline:  Goal status: INITIAL  2.  Pt will report decrease in pain by >/=90% Baseline:  Goal status: INITIAL  3.  Pt will demo improved functional LE by squatting/lifting at least 30# with </=2/10 pain Baseline:  Goal status: INITIAL  4.  Pt will have improved LEFS to >/=93% to demo MCID Baseline: 83.8% Goal status: INITIAL  PLAN:  PT FREQUENCY: 1x/week  PT DURATION: 8 weeks  PLANNED INTERVENTIONS: Therapeutic exercises, Therapeutic activity, Neuromuscular re-education, Balance training, Gait training, Patient/Family education, Self Care, Joint mobilization, Stair training, Dry Needling, Spinal manipulation, Spinal mobilization, Cryotherapy, Moist heat, Taping, Ionotophoresis 4mg /ml Dexamethasone, Manual therapy, and Re-evaluation  PLAN FOR NEXT SESSION: Assess response to HEP. Continue stretching glutes/piriformis, quads/hip flexors. Manual work or TPDN as indicated. Strengthen glute med.    Averey Trompeter April Ma L Mahaley Schwering, PT 12/10/2022, 8:39 AM

## 2022-12-15 ENCOUNTER — Other Ambulatory Visit: Payer: Self-pay

## 2022-12-15 ENCOUNTER — Ambulatory Visit: Payer: Medicare Other | Admitting: Orthopaedic Surgery

## 2022-12-15 ENCOUNTER — Encounter: Payer: Self-pay | Admitting: Orthopaedic Surgery

## 2022-12-15 DIAGNOSIS — M25552 Pain in left hip: Secondary | ICD-10-CM

## 2022-12-15 DIAGNOSIS — M7062 Trochanteric bursitis, left hip: Secondary | ICD-10-CM | POA: Diagnosis not present

## 2022-12-15 MED ORDER — LIDOCAINE HCL 1 % IJ SOLN
3.0000 mL | INTRAMUSCULAR | Status: AC | PRN
  Administered 2022-12-15: 3 mL

## 2022-12-15 MED ORDER — METHYLPREDNISOLONE ACETATE 40 MG/ML IJ SUSP
40.0000 mg | INTRAMUSCULAR | Status: AC | PRN
  Administered 2022-12-15: 40 mg via INTRA_ARTICULAR

## 2022-12-15 NOTE — Progress Notes (Signed)
Dillon Arias is a new patient for me but actually is well-known close we used to work together.  He comes in today with left hip pain he points to the trochanteric area as a source of his pain.  He has remotely had injections in the past but more recently had 2 series of steroid treatments in terms of oral steroids.  He says those helped greatly but the pain has come back.  It does travel down to the knee.  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  He has never had surgery on that left hip or right hip.  He does not walk with any significant limp.  He is in therapy now for his hip.  I did review all of his notes within epic in terms of medical history, social history and medications.  On exam his right hip and left hip both moves smoothly and fluidly.  He has negative straight leg raise.  His knee exam the left side is normal.  There is no blocks or rotation of his hip or his knee.  There is no blocks to flexion extension of the knee and the knee is ligamentously stable.  An AP pelvis and lateral of the left hip shows normal-appearing hips bilaterally with no significant arthritic findings.  There are some slight sclerotic changes in his SI joint on the left side.  His signs and symptoms seem to be consistent more with trochanteric bursitis and tendinitis.  I did recommend a steroid injection over the point of maximal tenderness and he agreed to this and tolerated it well.  He will continue his stretching exercises and therapy.  Follow-up can be as needed.  Since I do know Dillon Arias well he knows to reach out if this flares up again or it the injection did not help.     Procedure Note  Patient: Dillon Arias             Date of Birth: [birth year]           MRN: [id number]             Visit Date: 12/15/2022  Procedures: Visit Diagnoses:  1. Pain in left hip   2. Trochanteric bursitis, left hip     Large Joint Inj: L greater trochanter on 12/15/2022 9:10 AM Indications: pain and  diagnostic evaluation Details: 22 G 1.5 in needle, lateral approach  Arthrogram: No  Medications: 3 mL lidocaine 1 %; 40 mg methylPREDNISolone acetate 40 MG/ML Outcome: tolerated well, no immediate complications Procedure, treatment alternatives, risks and benefits explained, specific risks discussed. Consent was given by the patient. Immediately prior to procedure a time out was called to verify the correct patient, procedure, equipment, support staff and site/side marked as required. Patient was prepped and draped in the usual sterile fashion.

## 2022-12-17 ENCOUNTER — Ambulatory Visit: Payer: Medicare Other | Admitting: Physical Therapy

## 2022-12-17 DIAGNOSIS — R2689 Other abnormalities of gait and mobility: Secondary | ICD-10-CM | POA: Diagnosis not present

## 2022-12-17 DIAGNOSIS — M6281 Muscle weakness (generalized): Secondary | ICD-10-CM

## 2022-12-17 DIAGNOSIS — M25562 Pain in left knee: Secondary | ICD-10-CM

## 2022-12-17 DIAGNOSIS — M25552 Pain in left hip: Secondary | ICD-10-CM | POA: Diagnosis not present

## 2022-12-17 NOTE — Therapy (Signed)
OUTPATIENT PHYSICAL THERAPY LOWER EXTREMITY TREATMENT   Patient Name: NIKAN ELLINGSON MRN: [id number] DOB:02-20-1951, 72 y.o., male Today's Date: 12/17/2022  END OF SESSION:  PT End of Session - 12/17/22 0853     Visit Number 4    Number of Visits 8    Date for PT Re-Evaluation 01/21/23    Authorization Type BCBS    PT Start Time 0845    PT Stop Time 0930    PT Time Calculation (min) 45 min    Activity Tolerance Patient tolerated treatment well    Behavior During Therapy [facility] for tasks assessed/performed              Past Medical History:  Diagnosis Date   GERD (gastroesophageal reflux disease)    H/O renal calculi    History of kidney stones 2008   Hyperlipidemia    Hypertension    Past Surgical History:  Procedure Laterality Date   COLONOSCOPY WITH PROPOFOL N/A 01/22/2015   Procedure: COLONOSCOPY WITH PROPOFOL;  Surgeon: Charolett Bumpers, MD;  Location: [facility];  Service: Endoscopy;  Laterality: N/A;   EXTRACORPOREAL SHOCK WAVE LITHOTRIPSY     HERNIA REPAIR     Left   KIDNEY STONE SURGERY     LYMPHADENECTOMY Bilateral 02/10/2019   Procedure: LYMPHADENECTOMY, PELVIC;  Surgeon: Heloise Purpura, MD;  Location: [facility];  Service: Urology;  Laterality: Bilateral;   PILONIDAL CYST EXCISION     PTOSIS REPAIR     ROBOT ASSISTED LAPAROSCOPIC RADICAL PROSTATECTOMY N/A 02/10/2019   Procedure: XI ROBOTIC ASSISTED LAPAROSCOPIC RADICAL PROSTATECTOMY LEVEL 2;  Surgeon: Heloise Purpura, MD;  Location: [facility];  Service: Urology;  Laterality: N/A;  ONLY NEEDS 210 MIN FOR ALL PROCEDURES   Patient Active Problem List   Diagnosis Date Noted   Biochemically recurrent malignant neoplasm of prostate (HCC) 05/29/2021   Other shoulder lesions, unspecified shoulder 05/29/2021   Plantar fascial fibromatosis 05/29/2021   Sciatica 05/29/2021   Throat irritation 05/29/2021   Tinea unguium 05/29/2021   Vitamin D deficiency 05/29/2021   Morbid obesity (HCC) 05/29/2021   Mixed  hyperlipidemia 05/29/2021   Hyperglycemia 05/29/2021   Gastroesophageal reflux disease 05/29/2021   Eustachian tube dysfunction 05/29/2021   Essential hypertension 05/29/2021   Erectile dysfunction due to arterial insufficiency 05/29/2021   Elevated PSA 05/29/2021   Cough 05/29/2021   Contusion of shoulder region 05/29/2021   Congenital malformation 05/29/2021   Annual physical exam 05/29/2021   Coronary artery calcification 05/29/2021   Aortic calcification (HCC) 05/29/2021   Sigmoid diverticulosis 05/29/2021   Ventral hernia without obstruction or gangrene 05/29/2021   Renal calculi 05/29/2021   Prostate cancer (HCC) 02/10/2019   Laryngopharyngeal reflux (LPR) 01/03/2016   Thyroglossal duct cyst 11/13/2015   Thyroglossal duct infection 11/07/2015   Acute epiglottiditis 11/06/2015   Epiglottiditis 11/06/2015    PCP: Marden Noble, MD (Inactive)  REFERRING PROVIDER: Rosalene Billings, MD  REFERRING DIAG:  [phone] (ICD-10-CM) - Pain in left knee  M25.552 (ICD-10-CM) - Pain in left hip    THERAPY DIAG:  No diagnosis found.  Rationale for Evaluation and Treatment: Rehabilitation  ONSET DATE: 1.5 years ago  SUBJECTIVE:   SUBJECTIVE STATEMENT: Pt states he got a shot in his hip. Pt reports less aching in his hips.   PERTINENT HISTORY: N/a From eval: "[name]." Pt states he was put on an extended prednisone regime which cleared it up when it first occurred 1.5 years ago; however, in  the last month it's bothering him again. Pt states occasionally it will radiate to the front of his quad. Pt notes if he's stationary for prolonged periods his knee will also bother him. 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   PAIN:  Are you having pain? Yes: NPRS scale: 0 currently, at worst 6 to 7/10 Pain location: L quad/lateral knee mostly now; normally posterior hip Pain  description: tight; lateral dull ache in hip Aggravating factors: prolonged sitting (~1 hour), lifting >30 lbs Relieving factors: advil/tylenol  PRECAUTIONS: None  WEIGHT BEARING RESTRICTIONS: No  FALLS:  Has patient fallen in last 6 months? No  LIVING ENVIRONMENT: Lives with: lives with their spouse Lives in: House/apartment Stairs: No Has following equipment at home: None  OCCUPATION: Engineer, water  PLOF: Independent  PATIENT GOALS: Improve pain with mobility  NEXT MD VISIT: Seeing Dr. Magnus Ivan mid July  OBJECTIVE:   DIAGNOSTIC FINDINGS: Nothing recent on file  PATIENT SURVEYS:  LEFS 67/80 = 83.8%  MUSCLE LENGTH: Hamstrings: Right 80 deg; Left 80 deg Thomas test: Right 0 deg; Left 0 deg  PALPATION: L>R glute med tenderness  LOWER EXTREMITY ROM:  Active ROM Right eval Left eval  Hip flexion    Hip extension    Hip abduction    Hip adduction    Hip internal rotation    Hip external rotation    Knee flexion    Knee extension    Ankle dorsiflexion    Ankle plantarflexion    Ankle inversion    Ankle eversion     (Blank rows = not tested)  LOWER EXTREMITY MMT:  MMT Right eval Left eval  Hip flexion 4+ 4  Hip extension 4+ 4  Hip abduction 4 3+  Hip adduction    Hip internal rotation 4 3+  Hip external rotation 4 4  Knee flexion 5 5  Knee extension 5 5  Ankle dorsiflexion    Ankle plantarflexion    Ankle inversion    Ankle eversion     (Blank rows = not tested)  LOWER EXTREMITY SPECIAL TESTS:  Hip special tests: Luisa Hart (FABER) test: positive , Thomas test: negative, and Ely's test: negative Slump test (+) on L  FUNCTIONAL TESTS:  Did not assess  GAIT: Distance walked: 100 Assistive device utilized: None Level of assistance: Complete Independence Comments: [facility]  [facility] Adult PT Treatment:                                                DATE: 12/17/22 Therapeutic Exercise: Treadmill warm up 1.0 mph x 5 min Seated piriformis  stretch x30 sec Seated hamstring stretch x30 sec Seated hip flexor stretch x 30 sec Standing fire hydrant green TB 3x10 Counter donkey kick 3x10 Forward step ups 6" step x10 Side step ups 6" step x10 Eccentric step downs 6" step x10   [facility] Adult PT Treatment:                                                DATE: 12/10/22 Therapeutic Exercise: Recumbent bike L1 x 5 min Seated piriformis stretch 2x 30 sec Seated hip flexor stretch 2x30 sec Sidelying clamshell green TB  3x10 Prone hip ext with knee flexed 3x10 Supine SKTC x30 sec Captain morgan 10x5 sec Standing fire hydrant green TB 3x10 Modified woodpecker 2x10                                                                                                                     [facility] Adult PT Treatment:                                                DATE: 12/03/22 Therapeutic Exercise: Nustep L6 x 5 min LEs Seated piriformis stretch 2 x 30 sec Supine hip flexor + quad stretch with strap 2x30 sec Supine ITB stretch 2 x 30 sec Supine bridge 2x10x5 sec Sidelying clamshell red TB 3x10 Standing captain morgan 10x5 sec Modified woodpecker 2x10 Self Care: Self massage/TPR using ball for glute    PATIENT EDUCATION:  Education details: Exam findings, POC, initial HEP Person educated: Patient Education method: Explanation, Demonstration, and Handouts Education comprehension: verbalized understanding, returned demonstration, and needs further education  HOME EXERCISE PROGRAM: Access Code: [email or web address] URL: [email or web address] Date: 11/26/2022 Prepared by: Vernon Prey April Kirstie Peri  Exercises - Clamshell with Resistance  - 1 x daily - 7 x weekly - 2-3 sets - 10 reps - Supine ITB Stretch with Strap  - 1 x daily - 7 x weekly - 2 sets - 30 sec hold - Seated Piriformis Stretch  - 1 x daily - 7 x weekly - 2 sets - 30 sec hold - Supine Quadriceps Stretch with Strap on Table  - 1 x daily - 7 x weekly - 2 sets - 30 sec  hold  ASSESSMENT:  CLINICAL IMPRESSION: Pt with improving pain and glute activation. Pt got hip injection. Worked on hip exercises on steps to initiate strengthening for inclines/hills.   From eval: Patient is a 72 y.o. M who was seen today for physical therapy evaluation and treatment for L posterior hip pain radiating to front of knee. Assessment significant for weak and tender glute med on L likely pulling on to L ITB causing his radiating pain to the knee. Pt will benefit from PT to address these deficits for return to all normal function.   OBJECTIVE IMPAIRMENTS: decreased activity tolerance, decreased endurance, decreased mobility, decreased ROM, decreased strength, increased fascial restrictions, increased muscle spasms, improper body mechanics, postural dysfunction, and pain.    GOALS: Goals reviewed with patient? Yes  SHORT TERM GOALS: Target date: 12/24/2022  Pt will be ind with initial HEP Baseline: Goal status: MET  LONG TERM GOALS: Target date: 01/21/2023   Pt will be ind with management and progression of HEP Baseline:  Goal status: INITIAL  2.  Pt will report decrease in pain by >/=90% Baseline:  Goal status: INITIAL  3.  Pt will demo improved functional LE by squatting/lifting at least 30# with </=2/10 pain Baseline:  Goal status:  INITIAL  4.  Pt will have improved LEFS to >/=93% to demo MCID Baseline: 83.8% Goal status: INITIAL  PLAN:  PT FREQUENCY: 1x/week  PT DURATION: 8 weeks  PLANNED INTERVENTIONS: Therapeutic exercises, Therapeutic activity, Neuromuscular re-education, Balance training, Gait training, Patient/Family education, Self Care, Joint mobilization, Stair training, Dry Needling, Spinal manipulation, Spinal mobilization, Cryotherapy, Moist heat, Taping, Ionotophoresis 4mg /ml Dexamethasone, Manual therapy, and Re-evaluation  PLAN FOR NEXT SESSION: Assess response to HEP. Continue stretching glutes/piriformis, quads/hip flexors. Manual work or  TPDN as indicated. Strengthen glute med.    Marrianne Sica April Ma L Murl Zogg, PT 12/17/2022, 8:54 AM

## 2022-12-24 ENCOUNTER — Encounter: Payer: Self-pay | Admitting: Physical Therapy

## 2022-12-24 ENCOUNTER — Ambulatory Visit: Payer: Medicare Other | Admitting: Physical Therapy

## 2022-12-24 DIAGNOSIS — M25562 Pain in left knee: Secondary | ICD-10-CM | POA: Diagnosis not present

## 2022-12-24 DIAGNOSIS — M25552 Pain in left hip: Secondary | ICD-10-CM | POA: Diagnosis not present

## 2022-12-24 DIAGNOSIS — R2689 Other abnormalities of gait and mobility: Secondary | ICD-10-CM

## 2022-12-24 DIAGNOSIS — M6281 Muscle weakness (generalized): Secondary | ICD-10-CM | POA: Diagnosis not present

## 2022-12-24 NOTE — Therapy (Signed)
OUTPATIENT PHYSICAL THERAPY LOWER EXTREMITY TREATMENT   Patient Name: Dillon Arias MRN: [id number] DOB:1951-03-06, 72 y.o., male Today's Date: 12/24/2022  END OF SESSION:  PT End of Session - 12/24/22 0841     Visit Number 5    Number of Visits 8    Date for PT Re-Evaluation 01/21/23    Authorization Type BCBS    PT Start Time [phone]    PT Stop Time 0915   request to leave early   PT Time Calculation (min) 34 min    Activity Tolerance Patient tolerated treatment well    Behavior During Therapy [facility] for tasks assessed/performed              Past Medical History:  Diagnosis Date   GERD (gastroesophageal reflux disease)    H/O renal calculi    History of kidney stones 2008   Hyperlipidemia    Hypertension    Past Surgical History:  Procedure Laterality Date   COLONOSCOPY WITH PROPOFOL N/A 01/22/2015   Procedure: COLONOSCOPY WITH PROPOFOL;  Surgeon: Charolett Bumpers, MD;  Location: [facility];  Service: Endoscopy;  Laterality: N/A;   EXTRACORPOREAL SHOCK WAVE LITHOTRIPSY     HERNIA REPAIR     Left   KIDNEY STONE SURGERY     LYMPHADENECTOMY Bilateral 02/10/2019   Procedure: LYMPHADENECTOMY, PELVIC;  Surgeon: Heloise Purpura, MD;  Location: [facility];  Service: Urology;  Laterality: Bilateral;   PILONIDAL CYST EXCISION     PTOSIS REPAIR     ROBOT ASSISTED LAPAROSCOPIC RADICAL PROSTATECTOMY N/A 02/10/2019   Procedure: XI ROBOTIC ASSISTED LAPAROSCOPIC RADICAL PROSTATECTOMY LEVEL 2;  Surgeon: Heloise Purpura, MD;  Location: [facility];  Service: Urology;  Laterality: N/A;  ONLY NEEDS 210 MIN FOR ALL PROCEDURES   Patient Active Problem List   Diagnosis Date Noted   Biochemically recurrent malignant neoplasm of prostate (HCC) 05/29/2021   Other shoulder lesions, unspecified shoulder 05/29/2021   Plantar fascial fibromatosis 05/29/2021   Sciatica 05/29/2021   Throat irritation 05/29/2021   Tinea unguium 05/29/2021   Vitamin D deficiency 05/29/2021   Morbid obesity (HCC)  05/29/2021   Mixed hyperlipidemia 05/29/2021   Hyperglycemia 05/29/2021   Gastroesophageal reflux disease 05/29/2021   Eustachian tube dysfunction 05/29/2021   Essential hypertension 05/29/2021   Erectile dysfunction due to arterial insufficiency 05/29/2021   Elevated PSA 05/29/2021   Cough 05/29/2021   Contusion of shoulder region 05/29/2021   Congenital malformation 05/29/2021   Annual physical exam 05/29/2021   Coronary artery calcification 05/29/2021   Aortic calcification (HCC) 05/29/2021   Sigmoid diverticulosis 05/29/2021   Ventral hernia without obstruction or gangrene 05/29/2021   Renal calculi 05/29/2021   Prostate cancer (HCC) 02/10/2019   Laryngopharyngeal reflux (LPR) 01/03/2016   Thyroglossal duct cyst 11/13/2015   Thyroglossal duct infection 11/07/2015   Acute epiglottiditis 11/06/2015   Epiglottiditis 11/06/2015    PCP: Marden Noble, MD (Inactive)  REFERRING PROVIDER: Rosalene Billings, MD  REFERRING DIAG:  [phone] (ICD-10-CM) - Pain in left knee  M25.552 (ICD-10-CM) - Pain in left hip    THERAPY DIAG:  Pain in left hip  Acute pain of left knee  Muscle weakness (generalized)  Other abnormalities of gait and mobility  Rationale for Evaluation and Treatment: Rehabilitation  ONSET DATE: 1.5 years ago  SUBJECTIVE:   SUBJECTIVE STATEMENT: Pt states he had in-laws over this past week -- has not been as diligent with his exercises. Pt states he has to leave a little earlier today. Pain remains controlled.  PERTINENT  HISTORY: N/a From eval: "[name]." Pt states he was put on an extended prednisone regime which cleared it up when it first occurred 1.5 years ago; however, in the last month it's bothering him again. Pt states occasionally it will radiate to the front of his quad. Pt notes if he's stationary for prolonged periods his knee will also bother him. 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   PAIN:  Are you having pain? Yes: NPRS scale: 0 currently, at worst 6 to 7/10 Pain location: L quad/lateral knee mostly now; normally posterior hip Pain description: tight; lateral dull ache in hip Aggravating factors: prolonged sitting (~1 hour), lifting >30 lbs Relieving factors: advil/tylenol  PRECAUTIONS: None  WEIGHT BEARING RESTRICTIONS: No  FALLS:  Has patient fallen in last 6 months? No  LIVING ENVIRONMENT: Lives with: lives with their spouse Lives in: House/apartment Stairs: No Has following equipment at home: None  OCCUPATION: Engineer, water  PLOF: Independent  PATIENT GOALS: Improve pain with mobility  NEXT MD VISIT: n/a  OBJECTIVE:   DIAGNOSTIC FINDINGS: Nothing recent on file  PATIENT SURVEYS:  LEFS 67/80 = 83.8%  MUSCLE LENGTH: Hamstrings: Right 80 deg; Left 80 deg Thomas test: Right 0 deg; Left 0 deg  PALPATION: L>R glute med tenderness  LOWER EXTREMITY ROM:  Active ROM Right eval Left eval  Hip flexion    Hip extension    Hip abduction    Hip adduction    Hip internal rotation    Hip external rotation    Knee flexion    Knee extension    Ankle dorsiflexion    Ankle plantarflexion    Ankle inversion    Ankle eversion     (Blank rows = not tested)  LOWER EXTREMITY MMT:  MMT Right eval Left eval  Hip flexion 4+ 4  Hip extension 4+ 4  Hip abduction 4 3+  Hip adduction    Hip internal rotation 4 3+  Hip external rotation 4 4  Knee flexion 5 5  Knee extension 5 5  Ankle dorsiflexion    Ankle plantarflexion    Ankle inversion    Ankle eversion     (Blank rows = not tested)  LOWER EXTREMITY SPECIAL TESTS:  Hip special tests: Luisa Hart (FABER) test: positive , Thomas test: negative, and Ely's test: negative Slump test (+) on L  FUNCTIONAL TESTS:  Did not assess  GAIT: Distance walked: 100 Assistive device utilized: None Level of assistance: Complete  Independence Comments: WFL  [facility] Adult PT Treatment:                                                DATE: 12/24/22 Therapeutic Exercise: Treadmill warm up 0.8 mph with grade 2 incline x 5 min Sitting piriformis stretch x30 sec Sitting hamstring stretch x30 sec Seated hip flexor stretch x 30 sec Counter donkey kick 3x10 Forward T 3x10 5# KB Runner's step ups 5# KB 6" step 2x10 Side step up 5# KB 6" step 2x10   [facility] Adult PT Treatment:  DATE: 12/17/22 Therapeutic Exercise: Treadmill warm up 1.0 mph x 5 min Seated piriformis stretch x30 sec Seated hamstring stretch x30 sec Seated hip flexor stretch x 30 sec Standing fire hydrant green TB 3x10 Counter donkey kick 3x10 Forward step ups 6" step x10 Side step ups 6" step x10 Eccentric step downs 6" step x10   [facility] Adult PT Treatment:                                                DATE: 12/10/22 Therapeutic Exercise: Recumbent bike L1 x 5 min Seated piriformis stretch 2x 30 sec Seated hip flexor stretch 2x30 sec Sidelying clamshell green TB 3x10 Prone hip ext with knee flexed 3x10 Supine SKTC x30 sec Captain morgan 10x5 sec Standing fire hydrant green TB 3x10 Modified woodpecker 2x10                                                                                                                     [facility] Adult PT Treatment:                                                DATE: 12/03/22 Therapeutic Exercise: Nustep L6 x 5 min LEs Seated piriformis stretch 2 x 30 sec Supine hip flexor + quad stretch with strap 2x30 sec Supine ITB stretch 2 x 30 sec Supine bridge 2x10x5 sec Sidelying clamshell red TB 3x10 Standing captain morgan 10x5 sec Modified woodpecker 2x10 Self Care: Self massage/TPR using ball for glute    PATIENT EDUCATION:  Education details: Exam findings, POC, initial HEP Person educated: Patient Education method: Explanation, Demonstration, and Handouts Education  comprehension: verbalized understanding, returned demonstration, and needs further education  HOME EXERCISE PROGRAM: Access Code: [email or web address] URL: [email or web address] Date: 11/26/2022 Prepared by: Vernon Prey April Kirstie Peri  Exercises - Clamshell with Resistance  - 1 x daily - 7 x weekly - 2-3 sets - 10 reps - Supine ITB Stretch with Strap  - 1 x daily - 7 x weekly - 2 sets - 30 sec hold - Seated Piriformis Stretch  - 1 x daily - 7 x weekly - 2 sets - 30 sec hold - Supine Quadriceps Stretch with Strap on Table  - 1 x daily - 7 x weekly - 2 sets - 30 sec hold  ASSESSMENT:  CLINICAL IMPRESSION: Continued to progress pt's exercises. Worked on progressing pt's strength to more balance/stability exercises.   From eval: Patient is a 72 y.o. M who was seen today for physical therapy evaluation and treatment for L posterior hip pain radiating to front of knee. Assessment significant for weak and tender glute med on L likely pulling on to L ITB causing his radiating pain to the knee. Pt will benefit from  PT to address these deficits for return to all normal function.   OBJECTIVE IMPAIRMENTS: decreased activity tolerance, decreased endurance, decreased mobility, decreased ROM, decreased strength, increased fascial restrictions, increased muscle spasms, improper body mechanics, postural dysfunction, and pain.    GOALS: Goals reviewed with patient? Yes  SHORT TERM GOALS: Target date: 12/24/2022  Pt will be ind with initial HEP Baseline: Goal status: MET  LONG TERM GOALS: Target date: 01/21/2023   Pt will be ind with management and progression of HEP Baseline:  Goal status: INITIAL  2.  Pt will report decrease in pain by >/=90% Baseline:  Goal status: INITIAL  3.  Pt will demo improved functional LE by squatting/lifting at least 30# with </=2/10 pain Baseline:  Goal status: INITIAL  4.  Pt will have improved LEFS to >/=93% to demo MCID Baseline: 83.8% Goal status:  INITIAL  PLAN:  PT FREQUENCY: 1x/week  PT DURATION: 8 weeks  PLANNED INTERVENTIONS: Therapeutic exercises, Therapeutic activity, Neuromuscular re-education, Balance training, Gait training, Patient/Family education, Self Care, Joint mobilization, Stair training, Dry Needling, Spinal manipulation, Spinal mobilization, Cryotherapy, Moist heat, Taping, Ionotophoresis 4mg /ml Dexamethasone, Manual therapy, and Re-evaluation  PLAN FOR NEXT SESSION: Assess response to HEP. Continue stretching glutes/piriformis, quads/hip flexors. Manual work or TPDN as indicated. Strengthen glute med.    Jennefer Kopp April Ma L Laniece Hornbaker, PT 12/24/2022, 9:17 AM

## 2022-12-31 ENCOUNTER — Ambulatory Visit: Payer: Medicare Other | Admitting: Physical Therapy

## 2022-12-31 ENCOUNTER — Encounter: Payer: Self-pay | Admitting: Physical Therapy

## 2022-12-31 DIAGNOSIS — M25562 Pain in left knee: Secondary | ICD-10-CM | POA: Diagnosis not present

## 2022-12-31 DIAGNOSIS — M6281 Muscle weakness (generalized): Secondary | ICD-10-CM | POA: Diagnosis not present

## 2022-12-31 DIAGNOSIS — R2689 Other abnormalities of gait and mobility: Secondary | ICD-10-CM | POA: Diagnosis not present

## 2022-12-31 DIAGNOSIS — M25552 Pain in left hip: Secondary | ICD-10-CM | POA: Diagnosis not present

## 2022-12-31 NOTE — Therapy (Signed)
OUTPATIENT PHYSICAL THERAPY LOWER EXTREMITY TREATMENT AND DISCHARGE  PHYSICAL THERAPY DISCHARGE SUMMARY  Visits from Start of Care: 6  Current functional level related to goals / functional outcomes: See below   Remaining deficits: None   Education / Equipment: See below   Patient agrees to discharge. Patient goals were met. Patient is being discharged due to meeting the stated rehab goals.    Patient Name: Dillon Arias MRN: [id number] DOB:16-Oct-1950, 72 y.o., male Today's Date: 12/31/2022  END OF SESSION:  PT End of Session - 12/31/22 0844     Visit Number 6    Number of Visits 8    Date for PT Re-Evaluation 01/21/23    Authorization Type BCBS    PT Start Time 0845    PT Stop Time 0930    PT Time Calculation (min) 45 min    Activity Tolerance Patient tolerated treatment well    Behavior During Therapy [facility] for tasks assessed/performed             Past Medical History:  Diagnosis Date   GERD (gastroesophageal reflux disease)    H/O renal calculi    History of kidney stones 2008   Hyperlipidemia    Hypertension    Past Surgical History:  Procedure Laterality Date   COLONOSCOPY WITH PROPOFOL N/A 01/22/2015   Procedure: COLONOSCOPY WITH PROPOFOL;  Surgeon: Charolett Bumpers, MD;  Location: [facility];  Service: Endoscopy;  Laterality: N/A;   EXTRACORPOREAL SHOCK WAVE LITHOTRIPSY     HERNIA REPAIR     Left   KIDNEY STONE SURGERY     LYMPHADENECTOMY Bilateral 02/10/2019   Procedure: LYMPHADENECTOMY, PELVIC;  Surgeon: Heloise Purpura, MD;  Location: [facility];  Service: Urology;  Laterality: Bilateral;   PILONIDAL CYST EXCISION     PTOSIS REPAIR     ROBOT ASSISTED LAPAROSCOPIC RADICAL PROSTATECTOMY N/A 02/10/2019   Procedure: XI ROBOTIC ASSISTED LAPAROSCOPIC RADICAL PROSTATECTOMY LEVEL 2;  Surgeon: Heloise Purpura, MD;  Location: [facility];  Service: Urology;  Laterality: N/A;  ONLY NEEDS 210 MIN FOR ALL PROCEDURES   Patient Active Problem List   Diagnosis Date  Noted   Biochemically recurrent malignant neoplasm of prostate (HCC) 05/29/2021   Other shoulder lesions, unspecified shoulder 05/29/2021   Plantar fascial fibromatosis 05/29/2021   Sciatica 05/29/2021   Throat irritation 05/29/2021   Tinea unguium 05/29/2021   Vitamin D deficiency 05/29/2021   Morbid obesity (HCC) 05/29/2021   Mixed hyperlipidemia 05/29/2021   Hyperglycemia 05/29/2021   Gastroesophageal reflux disease 05/29/2021   Eustachian tube dysfunction 05/29/2021   Essential hypertension 05/29/2021   Erectile dysfunction due to arterial insufficiency 05/29/2021   Elevated PSA 05/29/2021   Cough 05/29/2021   Contusion of shoulder region 05/29/2021   Congenital malformation 05/29/2021   Annual physical exam 05/29/2021   Coronary artery calcification 05/29/2021   Aortic calcification (HCC) 05/29/2021   Sigmoid diverticulosis 05/29/2021   Ventral hernia without obstruction or gangrene 05/29/2021   Renal calculi 05/29/2021   Prostate cancer (HCC) 02/10/2019   Laryngopharyngeal reflux (LPR) 01/03/2016   Thyroglossal duct cyst 11/13/2015   Thyroglossal duct infection 11/07/2015   Acute epiglottiditis 11/06/2015   Epiglottiditis 11/06/2015    PCP: Marden Noble, MD (Inactive)  REFERRING PROVIDER: Rosalene Billings, MD  REFERRING DIAG:  [phone] (ICD-10-CM) - Pain in left knee  M25.552 (ICD-10-CM) - Pain in left hip    THERAPY DIAG:  Pain in left hip  Acute pain of left knee  Muscle weakness (generalized)  Other abnormalities of gait  and mobility  Rationale for Evaluation and Treatment: Rehabilitation  ONSET DATE: 1.5 years ago  SUBJECTIVE:   SUBJECTIVE STATEMENT: Pt states he has been walking more. Able to walk outside on different grades without problems. No longer takes any pain medication.   PERTINENT HISTORY: N/a From eval: "[name]." Pt states he was put on an extended prednisone regime which cleared it  up when it first occurred 1.5 years ago; however, in the last month it's bothering him again. Pt states occasionally it will radiate to the front of his quad. Pt notes if he's stationary for prolonged periods his knee will also bother him. 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   PAIN:  Are you having pain? Yes: NPRS scale: 0 currently, at worst 6 to 7/10 Pain location: L quad/lateral knee mostly now; normally posterior hip Pain description: tight; lateral dull ache in hip Aggravating factors: prolonged sitting (~1 hour), lifting >30 lbs Relieving factors: advil/tylenol  PRECAUTIONS: None  WEIGHT BEARING RESTRICTIONS: No  FALLS:  Has patient fallen in last 6 months? No  LIVING ENVIRONMENT: Lives with: lives with their spouse Lives in: House/apartment Stairs: No Has following equipment at home: None  OCCUPATION: Engineer, water  PLOF: Independent  PATIENT GOALS: Improve pain with mobility  NEXT MD VISIT: n/a  OBJECTIVE:   DIAGNOSTIC FINDINGS: Nothing recent on file  PATIENT SURVEYS:  LEFS 67/80 = 83.8%  MUSCLE LENGTH: Hamstrings: Right 80 deg; Left 80 deg Thomas test: Right 0 deg; Left 0 deg  PALPATION: L>R glute med tenderness  LOWER EXTREMITY ROM:  Active ROM Right eval Left eval  Hip flexion    Hip extension    Hip abduction    Hip adduction    Hip internal rotation    Hip external rotation    Knee flexion    Knee extension    Ankle dorsiflexion    Ankle plantarflexion    Ankle inversion    Ankle eversion     (Blank rows = not tested)  LOWER EXTREMITY MMT:  MMT Right eval Left eval  Hip flexion 4+ 4  Hip extension 4+ 4  Hip abduction 4 3+  Hip adduction    Hip internal rotation 4 3+  Hip external rotation 4 4  Knee flexion 5 5  Knee extension 5 5  Ankle dorsiflexion    Ankle plantarflexion    Ankle inversion    Ankle eversion     (Blank rows = not tested)  LOWER  EXTREMITY SPECIAL TESTS:  Hip special tests: Luisa Hart (FABER) test: positive , Thomas test: negative, and Ely's test: negative Slump test (+) on L  FUNCTIONAL TESTS:  Did not assess  GAIT: Distance walked: 100 Assistive device utilized: None Level of assistance: Complete Independence Comments: WFL  [facility] Adult PT Treatment:                                                DATE: 12/31/22 Therapeutic Exercise: Treadmill warm up 0.8 mph with grade 2-5 incline x 5 min Sitting piriformis stretch 2x30 sec Sitting hamstring stretch x30 sec Seated hip flexor stretch x30 sec Counter donkey kick 3x10 green TB Standing fire hydrant green TB 3x10 Squat 20# KB 3x10 Deadlift 30# cables 2x10   [facility] Adult PT  Treatment:                                                DATE: 12/24/22 Therapeutic Exercise: Treadmill warm up 0.8 mph with grade 2 incline x 5 min Sitting piriformis stretch x30 sec Sitting hamstring stretch x30 sec Seated hip flexor stretch x 30 sec Counter donkey kick 3x10 Forward T 3x10 5# KB Runner's step ups 5# KB 6" step 2x10 Side step up 5# KB 6" step 2x10   [facility] Adult PT Treatment:                                                DATE: 12/17/22 Therapeutic Exercise: Treadmill warm up 1.0 mph x 5 min Seated piriformis stretch x30 sec Seated hamstring stretch x30 sec Seated hip flexor stretch x 30 sec Standing fire hydrant green TB 3x10 Counter donkey kick 3x10 Forward step ups 6" step x10 Side step ups 6" step x10 Eccentric step downs 6" step x10   [facility] Adult PT Treatment:                                                DATE: 12/10/22 Therapeutic Exercise: Recumbent bike L1 x 5 min Seated piriformis stretch 2x 30 sec Seated hip flexor stretch 2x30 sec Sidelying clamshell green TB 3x10 Prone hip ext with knee flexed 3x10 Supine SKTC x30 sec Captain morgan 10x5 sec Standing fire hydrant green TB 3x10 Modified woodpecker 2x10   PATIENT EDUCATION:  Education details:  Exam findings, POC, initial HEP Person educated: Patient Education method: Explanation, Demonstration, and Handouts Education comprehension: verbalized understanding, returned demonstration, and needs further education  HOME EXERCISE PROGRAM: Access Code: [email or web address] URL: [email or web address] Date: 11/26/2022 Prepared by: Vernon Prey April Kirstie Peri  Exercises - Clamshell with Resistance  - 1 x daily - 7 x weekly - 2-3 sets - 10 reps - Supine ITB Stretch with Strap  - 1 x daily - 7 x weekly - 2 sets - 30 sec hold - Seated Piriformis Stretch  - 1 x daily - 7 x weekly - 2 sets - 30 sec hold - Supine Quadriceps Stretch with Strap on Table  - 1 x daily - 7 x weekly - 2 sets - 30 sec hold  ASSESSMENT:  CLINICAL IMPRESSION: Pt states he has been walking outside -- reports no issues on different grades. Pt has met all of his LTGs at this time. Pt reports return to his baseline status with no pain. Discussed progressions to continue his strengthening at home.   From eval: Patient is a 72 y.o. M who was seen today for physical therapy evaluation and treatment for L posterior hip pain radiating to front of knee. Assessment significant for weak and tender glute med on L likely pulling on to L ITB causing his radiating pain to the knee. Pt will benefit from PT to address these deficits for return to all normal function.   OBJECTIVE IMPAIRMENTS: decreased activity tolerance, decreased endurance, decreased mobility, decreased ROM, decreased strength, increased fascial restrictions, increased muscle spasms, improper body mechanics, postural dysfunction, and pain.  GOALS: Goals reviewed with patient? Yes  SHORT TERM GOALS: Target date: 12/24/2022  Pt will be ind with initial HEP Baseline: Goal status: MET  LONG TERM GOALS: Target date: 01/21/2023   Pt will be ind with management and progression of HEP Baseline:  Goal status: MET  2.  Pt will report decrease in pain by  >/=90% Baseline:  12/31/22: 100% Goal status: MET  3.  Pt will demo improved functional LE by squatting/lifting at least 30# with </=2/10 pain Baseline:  12/31/22: Able to lift 30# with no pain Goal status: MET  4.  Pt will have improved LEFS to >/=93% to demo MCID Baseline: 83.8% 12/31/22: 93.8% Goal status: MET  PLAN:  PT FREQUENCY: 1x/week  PT DURATION: 8 weeks  PLANNED INTERVENTIONS: Therapeutic exercises, Therapeutic activity, Neuromuscular re-education, Balance training, Gait training, Patient/Family education, Self Care, Joint mobilization, Stair training, Dry Needling, Spinal manipulation, Spinal mobilization, Cryotherapy, Moist heat, Taping, Ionotophoresis 4mg /ml Dexamethasone, Manual therapy, and Re-evaluation  PLAN FOR NEXT SESSION: Assess response to HEP. Continue stretching glutes/piriformis, quads/hip flexors. Manual work or TPDN as indicated. Strengthen glute med.    Anniebelle Devore April Ma L Maceo Hernan, PT 12/31/2022, 8:44 AM

## 2023-02-05 ENCOUNTER — Other Ambulatory Visit (HOSPITAL_COMMUNITY): Payer: Self-pay

## 2023-02-05 DIAGNOSIS — E782 Mixed hyperlipidemia: Secondary | ICD-10-CM | POA: Diagnosis not present

## 2023-02-05 DIAGNOSIS — F439 Reaction to severe stress, unspecified: Secondary | ICD-10-CM | POA: Diagnosis not present

## 2023-02-05 DIAGNOSIS — L821 Other seborrheic keratosis: Secondary | ICD-10-CM | POA: Diagnosis not present

## 2023-02-05 DIAGNOSIS — I10 Essential (primary) hypertension: Secondary | ICD-10-CM | POA: Diagnosis not present

## 2023-02-05 DIAGNOSIS — E1165 Type 2 diabetes mellitus with hyperglycemia: Secondary | ICD-10-CM | POA: Diagnosis not present

## 2023-02-05 DIAGNOSIS — L570 Actinic keratosis: Secondary | ICD-10-CM | POA: Diagnosis not present

## 2023-02-05 DIAGNOSIS — L918 Other hypertrophic disorders of the skin: Secondary | ICD-10-CM | POA: Diagnosis not present

## 2023-02-05 MED ORDER — PRAMOXINE HCL 1 % EX CREA
1.0000 | TOPICAL_CREAM | Freq: Three times a day (TID) | CUTANEOUS | 0 refills | Status: DC | PRN
Start: 2023-02-05 — End: 2023-04-07

## 2023-02-10 ENCOUNTER — Other Ambulatory Visit (HOSPITAL_COMMUNITY): Payer: Self-pay

## 2023-02-12 ENCOUNTER — Other Ambulatory Visit (HOSPITAL_COMMUNITY): Payer: Self-pay

## 2023-02-27 ENCOUNTER — Other Ambulatory Visit (HOSPITAL_COMMUNITY): Payer: Self-pay

## 2023-02-27 ENCOUNTER — Encounter: Payer: Self-pay | Admitting: Gastroenterology

## 2023-02-27 ENCOUNTER — Ambulatory Visit: Payer: Medicare Other | Admitting: Gastroenterology

## 2023-02-27 VITALS — BP 138/70 | HR 74 | Ht 67.0 in | Wt 209.0 lb

## 2023-02-27 DIAGNOSIS — K6289 Other specified diseases of anus and rectum: Secondary | ICD-10-CM | POA: Diagnosis not present

## 2023-02-27 DIAGNOSIS — K649 Unspecified hemorrhoids: Secondary | ICD-10-CM | POA: Diagnosis not present

## 2023-02-27 DIAGNOSIS — R194 Change in bowel habit: Secondary | ICD-10-CM | POA: Diagnosis not present

## 2023-02-27 DIAGNOSIS — K219 Gastro-esophageal reflux disease without esophagitis: Secondary | ICD-10-CM

## 2023-02-27 MED ORDER — NA SULFATE-K SULFATE-MG SULF 17.5-3.13-1.6 GM/177ML PO SOLN
1.0000 | ORAL | 0 refills | Status: DC
  Filled 2023-02-27: qty 354, 2d supply, fill #0

## 2023-02-27 MED ORDER — HYDROCORTISONE ACETATE 25 MG RE SUPP
25.0000 mg | Freq: Every evening | RECTAL | 1 refills | Status: DC
Start: 2023-02-27 — End: 2023-04-07
  Filled 2023-02-27: qty 12, 12d supply, fill #0

## 2023-02-27 NOTE — Patient Instructions (Addendum)
Please send my chart message in 2-3 weeks with update on symptoms.   We have sent the following medications to your pharmacy for you to pick up at your convenience:  Anusol suppositories - Use nightly for 7 nights, then use every other night until completion of prescription.  Suprep   You have been scheduled for an endoscopy and colonoscopy. Please follow the written instructions given to you at your visit today.  Please pick up your prep supplies at the pharmacy within the next 1-3 days.  If you use inhalers (even only as needed), please bring them with you on the day of your procedure.  DO NOT TAKE 7 DAYS PRIOR TO TEST- Trulicity (dulaglutide) Ozempic, Wegovy (semaglutide) Mounjaro (tirzepatide) Bydureon Bcise (exanatide extended release)  DO NOT TAKE 1 DAY PRIOR TO YOUR TEST Rybelsus (semaglutide) Adlyxin (lixisenatide) Victoza (liraglutide) Byetta (exanatide) ___________________________________________________________________________  _______________________________________________________  If your blood pressure at your visit was 140/90 or greater, please contact your primary care physician to follow up on this.  _______________________________________________________  If you are age 73 or older, your body mass index should be between 23-30. Your Body mass index is 32.73 kg/m. If this is out of the aforementioned range listed, please consider follow up with your Primary Care Provider.  If you are age 54 or younger, your body mass index should be between 19-25. Your Body mass index is 32.73 kg/m. If this is out of the aformentioned range listed, please consider follow up with your Primary Care Provider.   ________________________________________________________  The [facility] providers would like to encourage you to use [facility] to communicate with providers for non-urgent requests or questions.  Due to long hold times on the telephone, sending your provider a message by  [facility] may be a faster and more efficient way to get a response.  Please allow 48 business hours for a response.  Please remember that this is for non-urgent requests.  _______________________________________________________ Due to recent changes in healthcare laws, you may see the results of your imaging and laboratory studies on MyChart before your provider has had a chance to review them.  We understand that in some cases there may be results that are confusing or concerning to you. Not all laboratory results come back in the same time frame and the provider may be waiting for multiple results in order to interpret others.  Please give [place] 48 hours in order for your provider to thoroughly review all the results before contacting the office for clarification of your results.   Thank you for choosing me and [facility].  Dr. Meridee Score

## 2023-02-27 NOTE — Progress Notes (Unsigned)
GASTROENTEROLOGY OUTPATIENT CLINIC VISIT   Primary Care Provider Emilio Aspen, MD [address] [place] [address] [phone]  Referring Provider Marden Noble, MD [address] [place],  [place] [address] [phone]  Patient Profile: Dillon Arias is a 72 y.o. male with a pmh significant for hypertension, hyperlipidemia, nephrolithiasis, prostate cancer (status post prostatectomy + radiation), GERD, diverticulosis.  The patient presents to the [facility] for an evaluation and management of problem(s) noted below:  Problem List 1. Change in bowel habits   2. Gastroesophageal reflux disease, unspecified whether esophagitis present   3. Rectal pain   4. Hemorrhoids, unspecified hemorrhoid type    Discussed the use of AI scribe software for clinical note transcription with the patient, who gave verbal consent to proceed.   History of Present Illness The patient, previously under the care of Dr. Laural Benes and Dr. Evette Cristal, presents for his first clinic evaluation with the [facility].  Normally, the patient would have a bowel movement on a regular daily basis, being well-formed and brown.  Approximately seven weeks prior to today's evaluation, he began to experience loose stools following a significantly spicy meal.  The symptoms of changes in bowel habits and abdominal pain and cramping lasted for 2-3 days and then he had significant constipation requiring straining to allow defecation.  Within a 10-day period he then had this pattern repeat itself, with a bloated feeling preceding urgency and loose stools and subsequent constipation.  Since these episodes, he has slowly had an improvement in his bowels to be more regular in consistency and pattern.  However, he has reported intermittent stinging and irritation around the anus/rectum.  He denies any rectal bleeding currently.  Years prior he had a single episode of bleeding and  went to Dr. Evette Cristal and they decided to hold off on colonoscopic evaluation.  Occasional post-defecation stinging still is noted.  He has a history of small hemorrhoids and has been treated for this in the past and when he saw his PCP he brought up these symptoms.  A bulging in the rectal area during the recent episodes of constipation was noted.  He has been initiated on anusol cream that he is using regularly, and it is improving his symptoms.  He hopes to continue to feel well.  He wants to be proactive and make sure he does not have a colon cancer causing issues either.  He has a history of gastroesophageal reflux disease and was previously on famotidine and Protonix.  He discontinued Protonix in April of the current year due to concerns about chronic use and potential side-effects. He remains on famotidine and reports no current symptoms of heartburn.  He has had GERD for >25 years and has never had an upper endoscopy even though he had been on regular PPI and H2RA therapy and thus never screened for Barrett's.  He also has a history of prostate cancer, treated with robotic prostatectomy and subsequent radiation therapy.  Although we do not have the records yet, he reports his last colonoscopy was in 2016, which revealed diverticulosis but no polyps. He also had a PET scan in 2022 which did not show any significant abnormalities.   GI Review of Systems Positive as above Negative for  Pyrosis; Reflux; Regurgitation; Dysphagia; Odynophagia; Globus; Post-prandial cough; Nocturnal cough; Nasal regurgitation; Epigastric pain; Nausea; Vomiting; Hematemesis; Jaundice; Change in Appetite; Early satiety; Abdominal pain; Abdominal bloating; Eructation; Flatulence; Change in BM Frequency; Change in BM Consistency; Constipation;  Diarrhea; Incontinence; Urgency; Tenesmus; Hematochezia; Melena  Review of Systems General: Denies fevers/chills/weight loss/night sweats HEENT: Denies oral lesions/sore  throat/headaches/visual changes Cardiovascular: Denies chest pain/palpitations Pulmonary: Denies shortness of breath/cough Gastroenterological: See HPI Genitourinary: Denies darkened urine or hematuria Hematological: Denies easy bruising/bleeding Endocrine: Denies temperature intolerance Dermatological: Denies skin changes Psychological: Mood is stable Allergy & Immunology: Denies severe allergic reactions Musculoskeletal: Denies new arthralgias   Medications Current Outpatient Medications  Medication Sig Dispense Refill   Acetaminophen (TYLENOL 8 HOUR PO) Take 500 mg by mouth as needed.     atorvastatin (LIPITOR) 20 MG tablet Take 1 tablet (20 mg total) by mouth daily. 90 tablet 3   Cholecalciferol (VITAMIN D) 50 MCG (2000 UT) tablet 1 tablet     famotidine (PEPCID) 20 MG tablet Take 1 tablet (20 mg total) by mouth in the morning. 90 tablet 3   fexofenadine (ALLEGRA) 180 MG tablet 1 tablet     hydrocortisone (ANUSOL-HC) 25 MG suppository Place 1 suppository (25 mg total) rectally at bedtime. 12 suppository 1   magnesium chloride (SLOW-MAG) 64 MG TBEC SR tablet Take 1 tablet by mouth daily.     Multiple Vitamin (MULTIVITAMIN ADULT PO)      Na Sulfate-K Sulfate-Mg Sulf (SUPREP BOWEL PREP KIT) 17.5-3.13-1.6 GM/177ML SOLN Take as directed for colonoscopy prep 354 mL 0   Pramoxine HCl 1 % CREA Apply 1 Application topically 3 (three) times daily as needed. 60 g 0   valsartan-hydrochlorothiazide (DIOVAN-HCT) 160-12.5 MG tablet Take 1 tablet by mouth daily. 90 tablet 3   No current facility-administered medications for this visit.    Allergies Allergies  Allergen Reactions   Codeine Other (See Comments)    dysphoric    Terbinafine And Related Nausea Only    Flu like symptoms    Histories Past Medical History:  Diagnosis Date   GERD (gastroesophageal reflux disease)    H/O renal calculi    History of kidney stones 2008   Hyperlipidemia    Hypertension    Prostate cancer [facility])     Past Surgical History:  Procedure Laterality Date   COLONOSCOPY WITH PROPOFOL N/A 01/22/2015   Procedure: COLONOSCOPY WITH PROPOFOL;  Surgeon: Charolett Bumpers, MD;  Location: [facility];  Service: Endoscopy;  Laterality: N/A;   EXTRACORPOREAL SHOCK WAVE LITHOTRIPSY     INGUINAL HERNIA REPAIR Left    in college   KIDNEY STONE SURGERY     LYMPHADENECTOMY Bilateral 02/10/2019   Procedure: LYMPHADENECTOMY, PELVIC;  Surgeon: Heloise Purpura, MD;  Location: [facility];  Service: Urology;  Laterality: Bilateral;   PILONIDAL CYST EXCISION     PTOSIS REPAIR     age 17   ROBOT ASSISTED LAPAROSCOPIC RADICAL PROSTATECTOMY N/A 02/10/2019   Procedure: XI ROBOTIC ASSISTED LAPAROSCOPIC RADICAL PROSTATECTOMY LEVEL 2;  Surgeon: Heloise Purpura, MD;  Location: [facility];  Service: Urology;  Laterality: N/A;  ONLY NEEDS 210 MIN FOR ALL PROCEDURES   Social History   Socioeconomic History   Marital status: Married    Spouse name: Not on file   Number of children: 2   Years of education: Not on file   Highest education level: Not on file  Occupational History   Occupation: retired Scientist, clinical (histocompatibility and immunogenetics)  Tobacco Use   Smoking status: Never   Smokeless tobacco: Never  Vaping Use   Vaping status: Never Used  Substance and Sexual Activity   Alcohol use: No   Drug use: No   Sexual activity: Not on file  Other Topics Concern  Not on file  Social History Narrative   Not on file   Social Determinants of Health   Financial Resource Strain: Not on file  Food Insecurity: Not on file  Transportation Needs: Not on file  Physical Activity: Not on file  Stress: Not on file  Social Connections: Not on file  Intimate Partner Violence: Not on file   Family History  Problem Relation Age of Onset   Hypertension Mother    Nephrolithiasis Mother    Cerebral aneurysm Mother 71       died   Hypertension Father    Diabetes Father    Nephrolithiasis Father    Heart attack Father    Prostate cancer Brother    Breast cancer  Maternal Grandmother    Esophageal cancer Maternal Grandfather    Colon cancer Neg Hx    I have reviewed his medical, social, and family history in detail and updated the electronic medical record as necessary.    PHYSICAL EXAMINATION  BP 138/70   Pulse 74   Ht 5\' 7"  (1.702 m)   Wt 209 lb (94.8 kg)   BMI 32.73 kg/m  Wt Readings from Last 3 Encounters:  02/27/23 209 lb (94.8 kg)  05/29/21 225 lb (102.1 kg)  02/10/19 225 lb 1.4 oz (102.1 kg)   GEN: NAD, appears stated age, doesn't appear chronically ill PSYCH: Cooperative, without pressured speech EYE: Conjunctivae pink, sclerae anicteric ENT: MMM, without oral ulcers, no erythema or exudates noted NECK: Supple CV: RR without R/Gs  RESP: CTAB posteriorly, without wheezing GI: NABS, soft, NT/ND, without rebound or guarding, no HSM appreciated GU: DRE shows MSK/EXT: _ edema, no palmar erythema SKIN: No jaundice, no spider angiomata, no concerning rashes NEURO:  Alert & Oriented x 3, no focal deficits, no evidence of asterixis   REVIEW OF DATA  I reviewed the following data at the time of this encounter:  GI Procedures and Studies  ***  Laboratory Studies  ***  Imaging Studies  ***   ASSESSMENT  Mr. Virnig is a 72 y.o. male with a pmh significant for The patient is seen today for evaluation and management of:  1. Change in bowel habits   2. Gastroesophageal reflux disease, unspecified whether esophagitis present   3. Rectal pain   4. Hemorrhoids, unspecified hemorrhoid type     ***   PLAN  There are no diagnoses linked to this encounter.   Orders Placed This Encounter  Procedures   Ambulatory referral to Gastroenterology    New Prescriptions   HYDROCORTISONE (ANUSOL-HC) 25 MG SUPPOSITORY    Place 1 suppository (25 mg total) rectally at bedtime.   NA SULFATE-K SULFATE-MG SULF (SUPREP BOWEL PREP KIT) 17.5-3.13-1.6 GM/177ML SOLN    Take as directed for colonoscopy prep   Modified Medications   No  medications on file    Planned Follow Up No follow-ups on file.   Total Time in Face-to-Face and in Coordination of Care for patient including independent/personal interpretation/review of prior testing, medical history, examination, medication adjustment, communicating results with the patient directly, and documentation within the EHR is ***.   Corliss Parish, MD [facility] Advanced Endoscopy Office # [phone]

## 2023-03-02 ENCOUNTER — Other Ambulatory Visit (HOSPITAL_COMMUNITY): Payer: Self-pay

## 2023-03-09 ENCOUNTER — Other Ambulatory Visit (HOSPITAL_COMMUNITY): Payer: Self-pay

## 2023-03-10 ENCOUNTER — Other Ambulatory Visit (HOSPITAL_COMMUNITY): Payer: Self-pay

## 2023-03-24 ENCOUNTER — Encounter: Payer: Self-pay | Admitting: Gastroenterology

## 2023-03-30 ENCOUNTER — Encounter: Payer: Self-pay | Admitting: Gastroenterology

## 2023-03-30 DIAGNOSIS — K080 Exfoliation of teeth due to systemic causes: Secondary | ICD-10-CM | POA: Diagnosis not present

## 2023-03-30 NOTE — Progress Notes (Signed)
2016 Colonoscopy Dr. Annia Friendly  Normal screening colonoscopy with left colonic diverticulosis burden.  This will be scanned into the chart.  Corliss Parish, MD [facility]oscopy Office # [phone]

## 2023-04-07 ENCOUNTER — Other Ambulatory Visit (HOSPITAL_COMMUNITY): Payer: Self-pay

## 2023-04-07 ENCOUNTER — Encounter: Payer: Self-pay | Admitting: Gastroenterology

## 2023-04-07 ENCOUNTER — Ambulatory Visit: Payer: Medicare Other | Admitting: Gastroenterology

## 2023-04-07 VITALS — BP 122/72 | HR 53 | Temp 98.2°F | Resp 20 | Ht 67.0 in | Wt 209.0 lb

## 2023-04-07 DIAGNOSIS — Z1211 Encounter for screening for malignant neoplasm of colon: Secondary | ICD-10-CM

## 2023-04-07 DIAGNOSIS — K219 Gastro-esophageal reflux disease without esophagitis: Secondary | ICD-10-CM

## 2023-04-07 DIAGNOSIS — R194 Change in bowel habit: Secondary | ICD-10-CM

## 2023-04-07 DIAGNOSIS — K317 Polyp of stomach and duodenum: Secondary | ICD-10-CM | POA: Diagnosis not present

## 2023-04-07 DIAGNOSIS — K2990 Gastroduodenitis, unspecified, without bleeding: Secondary | ICD-10-CM | POA: Diagnosis not present

## 2023-04-07 DIAGNOSIS — K209 Esophagitis, unspecified without bleeding: Secondary | ICD-10-CM | POA: Diagnosis not present

## 2023-04-07 DIAGNOSIS — K319 Disease of stomach and duodenum, unspecified: Secondary | ICD-10-CM

## 2023-04-07 DIAGNOSIS — K3189 Other diseases of stomach and duodenum: Secondary | ICD-10-CM

## 2023-04-07 MED ORDER — OMEPRAZOLE 40 MG PO CPDR
40.0000 mg | DELAYED_RELEASE_CAPSULE | Freq: Every day | ORAL | 2 refills | Status: DC
  Filled 2023-04-07: qty 30, 30d supply, fill #0
  Filled 2023-05-04: qty 30, 30d supply, fill #1
  Filled 2023-06-07: qty 30, 30d supply, fill #2

## 2023-04-07 MED ORDER — SODIUM CHLORIDE 0.9 % IV SOLN: 500.0000 mL | Freq: Once | INTRAVENOUS | Status: DC

## 2023-04-07 NOTE — Op Note (Signed)
[facility] Patient Name: Dillon Arias Procedure Date: 04/07/2023 3:21 PM MRN: [id number] Endoscopist: Corliss Parish , MD, [id number] Age: 72 Referring MD:  Date of Birth: 1950-11-17 Gender: Male Account #: [id number] Procedure:                Upper GI endoscopy Indications:              Heartburn Medicines:                Monitored Anesthesia Care Procedure:                Pre-Anesthesia Assessment:                           - Prior to the procedure, a History and Physical                            was performed, and patient medications and                            allergies were reviewed. The patient's tolerance of                            previous anesthesia was also reviewed. The risks                            and benefits of the procedure and the sedation                            options and risks were discussed with the patient.                            All questions were answered, and informed consent                            was obtained. Prior Anticoagulants: The patient has                            taken no anticoagulant or antiplatelet agents. ASA                            Grade Assessment: II - A patient with mild systemic                            disease. After reviewing the risks and benefits,                            the patient was deemed in satisfactory condition to                            undergo the procedure.                           After obtaining informed consent, the endoscope was  passed under direct vision. Throughout the                            procedure, the patient's blood pressure, pulse, and                            oxygen saturations were monitored continuously. The                            Olympus scope [id number] was introduced through the                            mouth, and advanced to the second part of duodenum.                            The upper GI endoscopy was  accomplished without                            difficulty. The patient tolerated the procedure. Scope In: Scope Out: Findings:                 No gross lesions were noted in the proximal                            esophagus and in the mid esophagus.                           LA Grade A (one or more mucosal breaks less than 5                            mm, not extending between tops of 2 mucosal folds)                            esophagitis with no bleeding was found in the                            distal esophagus.                           The Z-line was irregular and was found 37 cm from                            the incisors.                           Multiple small sessile polyps were found in the                            gastric body (consistent with fundic gland polyps).                           Segmental moderate inflammation characterized by  erosions and erythema was found in the gastric                            antrum.                           No other gross lesions were noted in the entire                            examined stomach. Biopsies were taken with a cold                            forceps for histology and Helicobacter pylori                            testing.                           Patchy moderate inflammation characterized by                            erosions and erythema was found in the duodenal                            bulb, in the first portion of the duodenum and in                            the second portion of the duodenum. Biopsies were                            taken with a cold forceps for histology. Complications:            No immediate complications. Estimated Blood Loss:     Estimated blood loss was minimal. Impression:               - No gross lesions in the proximal esophagus and in                            the mid esophagus.                           - LA Grade A esophagitis with no bleeding.                            - Z-line irregular, 37 cm from the incisors.                           - Multiple gastric polyps (consistent with fundic                            gland polyps).                           - Antral gastritis. No other gross lesions in the  entire stomach. Biopsied.                           - Duodenitis. Biopsied. Recommendation:           - Proceed to scheduled colonoscopy.                           - Observe patient's clinical course.                           - Await pathology results.                           - Initiate omeprazole 40 mg once daily. Continue                            for next 2 to 3 months. Consideration of down                            titration to 20 mg once daily to ensure no issues                            in regards to GERD or persisting esophagitis.                           - Patient has grade a esophagitis, so not clear                            that a repeat upper endoscopy is required but could                            discuss further with patient if desired (no sooner                            than 4 months from now).                           - The findings and recommendations were discussed                            with the patient.                           - The findings and recommendations were discussed                            with the patient's family. Corliss Parish, MD 04/07/2023 3:56:05 PM

## 2023-04-07 NOTE — Patient Instructions (Addendum)
Handouts Provided:  Diverticulosis, High Fiber Diet and Esophagitis  START taking omeprazole 40 mg once daily.  Continue for next 2-3 months.  Consideration of down titration to 20 mg once daily to ensure no issues in regards to GERD or persisting esophagitis. Medication has been sent to your pharmacy.   YOU HAD AN ENDOSCOPIC PROCEDURE TODAY AT THE [facility]:   Refer to the procedure report that was given to you for any specific questions about what was found during the examination.  If the procedure report does not answer your questions, please call your gastroenterologist to clarify.  If you requested that your care partner not be given the details of your procedure findings, then the procedure report has been included in a sealed envelope for you to review at your convenience later.  YOU SHOULD EXPECT: Some feelings of bloating in the abdomen. Passage of more gas than usual.  Walking can help get rid of the air that was put into your GI tract during the procedure and reduce the bloating. If you had a lower endoscopy (such as a colonoscopy or flexible sigmoidoscopy) you may notice spotting of blood in your stool or on the toilet paper. If you underwent a bowel prep for your procedure, you may not have a normal bowel movement for a few days.  Please Note:  You might notice some irritation and congestion in your nose or some drainage.  This is from the oxygen used during your procedure.  There is no need for concern and it should clear up in a day or so.  SYMPTOMS TO REPORT IMMEDIATELY:  Following lower endoscopy (colonoscopy or flexible sigmoidoscopy):  Excessive amounts of blood in the stool  Significant tenderness or worsening of abdominal pains  Swelling of the abdomen that is new, acute  Fever of 100F or higher  Following upper endoscopy (EGD)  Vomiting of blood or coffee ground material  New chest pain or pain under the shoulder blades  Painful or persistently difficult  swallowing  New shortness of breath  Fever of 100F or higher  Black, tarry-looking stools  For urgent or emergent issues, a gastroenterologist can be reached at any hour by calling [phone]. Do not use MyChart messaging for urgent concerns.    DIET:  We do recommend a small meal at first, but then you may proceed to your regular diet.  Drink plenty of fluids but you should avoid alcoholic beverages for 24 hours.  ACTIVITY:  You should plan to take it easy for the rest of today and you should NOT DRIVE or use heavy machinery until tomorrow (because of the sedation medicines used during the test).    FOLLOW UP: Our staff will call the number listed on your records the next business day following your procedure.  We will call around 7:15- 8:00 am to check on you and address any questions or concerns that you may have regarding the information given to you following your procedure. If we do not reach you, we will leave a message.     If any biopsies were taken you will be contacted by phone or by letter within the next 1-3 weeks.  Please call us at [phone] if you have not heard about the biopsies in 3 weeks.    SIGNATURES/CONFIDENTIALITY: You and/or your care partner have signed paperwork which will be entered into your electronic medical record.  These signatures attest to the fact that that the information above on your After Visit Summary  has been reviewed and is understood.  Full responsibility of the confidentiality of this discharge information lies with you and/or your care-partner.

## 2023-04-07 NOTE — Progress Notes (Signed)
Pt's states no medical or surgical changes since previsit or office visit. 

## 2023-04-07 NOTE — Progress Notes (Signed)
Vss nad trans to pacu 

## 2023-04-07 NOTE — Progress Notes (Signed)
Called to room to assist during endoscopic procedure.  Patient ID and intended procedure confirmed with present staff. Received instructions for my participation in the procedure from the performing physician.  

## 2023-04-07 NOTE — Op Note (Signed)
[facility] Patient Name: Dillon Arias Procedure Date: 04/07/2023 3:20 PM MRN: [id number] Endoscopist: Corliss Parish , MD, [id number] Age: 72 Referring MD:  Date of Birth: [birth year] Gender: Male Account #: [id number] Procedure:                Colonoscopy Indications:              Screening for colorectal malignant neoplasm, Rectal                            Pain Medicines:                Monitored Anesthesia Care Procedure:                Pre-Anesthesia Assessment:                           - Prior to the procedure, a History and Physical                            was performed, and patient medications and                            allergies were reviewed. The patient's tolerance of                            previous anesthesia was also reviewed. The risks                            and benefits of the procedure and the sedation                            options and risks were discussed with the patient.                            All questions were answered, and informed consent                            was obtained. Prior Anticoagulants: The patient has                            taken no anticoagulant or antiplatelet agents. ASA                            Grade Assessment: II - A patient with mild systemic                            disease. After reviewing the risks and benefits,                            the patient was deemed in satisfactory condition to                            undergo the procedure.  After obtaining informed consent, the colonoscope                            was passed under direct vision. Throughout the                            procedure, the patient's blood pressure, pulse, and                            oxygen saturations were monitored continuously. The                            CF [id number] #[id number] was introduced through the anus                            and advanced to the 3 cm into the ileum. The                             colonoscopy was performed without difficulty. The                            patient tolerated the procedure. The quality of the                            bowel preparation was adequate. The terminal ileum,                            ileocecal valve, appendiceal orifice, and rectum                            were photographed. Scope In: 3:36:58 PM Scope Out: 3:48:04 PM Scope Withdrawal Time: 0 hours 7 minutes 49 seconds  Total Procedure Duration: 0 hours 11 minutes 6 seconds  Findings:                 The digital rectal exam findings include                            hemorrhoids. Pertinent negatives include no                            palpable rectal lesions.                           The terminal ileum and ileocecal valve appeared                            normal.                           Multiple small-mouthed diverticula were found in                            the recto-sigmoid colon and sigmoid colon.  Normal mucosa was found in the entire colon                            otherwise.                           Non-bleeding non-thrombosed external and internal                            hemorrhoids were found during retroflexion, during                            perianal exam and during digital exam. The                            hemorrhoids were Grade III (internal hemorrhoids                            that prolapse but require manual reduction). Complications:            No immediate complications. Estimated Blood Loss:     Estimated blood loss: none. Impression:               - Hemorrhoids found on digital rectal exam.                           - The examined portion of the ileum was normal.                           - Diverticulosis in the recto-sigmoid colon and in                            the sigmoid colon.                           - Normal mucosa in the entire examined colon                            otherwise.                            - Non-bleeding non-thrombosed external and internal                            hemorrhoids. Recommendation:           - The patient will be observed post-procedure,                            until all discharge criteria are met.                           - Discharge patient to home.                           - Patient has a contact number available for  emergencies. The signs and symptoms of potential                            delayed complications were discussed with the                            patient. Return to normal activities tomorrow.                            Written discharge instructions were provided to the                            patient.                           - Continue present medications.                           - Consider Diltiazem or Nitroglycerin ointment to                            consider treatment for fissue even though we                            haven't found to see if this further improves                            symptoms of rectal discomfort.                           - Repeat colonoscopy in 10 years for screening                            purposes.                           - Not clear that hemorrhoidal banding will make                            difference for pain, but consideration further if                            we treat empirically for fissure and still has                            issues (would need to discuss with my colleagues                            before referral to them).                           - The findings and recommendations were discussed                            with the patient.                           -  The findings and recommendations were discussed                            with the patient's family. Corliss Parish, MD 04/07/2023 4:07:19 PM

## 2023-04-07 NOTE — Progress Notes (Signed)
GASTROENTEROLOGY PROCEDURE H&P NOTE   Primary Care Physician: Emilio Aspen, MD  HPI: Dillon Arias is a 72 y.o. male who presents for EGD/Colonoscopy for evaluation of GERD, Colon Cancer screening with incidental rectal pain and hemorrhoids.  Past Medical History:  Diagnosis Date   GERD (gastroesophageal reflux disease)    H/O renal calculi    History of kidney stones 2008   Hyperlipidemia    Hypertension    Prostate cancer [facility])    Past Surgical History:  Procedure Laterality Date   COLONOSCOPY WITH PROPOFOL N/A 01/22/2015   Procedure: COLONOSCOPY WITH PROPOFOL;  Surgeon: Charolett Bumpers, MD;  Location: [facility];  Service: Endoscopy;  Laterality: N/A;   EXTRACORPOREAL SHOCK WAVE LITHOTRIPSY     INGUINAL HERNIA REPAIR Left    in college   KIDNEY STONE SURGERY     LYMPHADENECTOMY Bilateral 02/10/2019   Procedure: LYMPHADENECTOMY, PELVIC;  Surgeon: Heloise Purpura, MD;  Location: [facility];  Service: Urology;  Laterality: Bilateral;   PILONIDAL CYST EXCISION     PTOSIS REPAIR     age 75   ROBOT ASSISTED LAPAROSCOPIC RADICAL PROSTATECTOMY N/A 02/10/2019   Procedure: XI ROBOTIC ASSISTED LAPAROSCOPIC RADICAL PROSTATECTOMY LEVEL 2;  Surgeon: Heloise Purpura, MD;  Location: [facility];  Service: Urology;  Laterality: N/A;  ONLY NEEDS 210 MIN FOR ALL PROCEDURES   Current Outpatient Medications  Medication Sig Dispense Refill   Acetaminophen (TYLENOL 8 HOUR PO) Take 500 mg by mouth as needed.     atorvastatin (LIPITOR) 20 MG tablet Take 1 tablet (20 mg total) by mouth daily. 90 tablet 3   Cholecalciferol (VITAMIN D) 50 MCG (2000 UT) tablet 1 tablet     famotidine (PEPCID) 20 MG tablet Take 1 tablet (20 mg total) by mouth in the morning. 90 tablet 3   fexofenadine (ALLEGRA) 180 MG tablet 1 tablet     hydrocortisone (ANUSOL-HC) 25 MG suppository Place 1 suppository (25 mg total) rectally at bedtime. 12 suppository 1   magnesium chloride (SLOW-MAG) 64 MG TBEC SR tablet Take 1  tablet by mouth daily.     Multiple Vitamin (MULTIVITAMIN ADULT PO)      Na Sulfate-K Sulfate-Mg Sulf (SUPREP BOWEL PREP KIT) 17.5-3.13-1.6 GM/177ML SOLN Take as directed for colonoscopy prep 354 mL 0   Pramoxine HCl 1 % CREA Apply 1 Application topically 3 (three) times daily as needed. 60 g 0   valsartan-hydrochlorothiazide (DIOVAN-HCT) 160-12.5 MG tablet Take 1 tablet by mouth daily. 90 tablet 3   No current facility-administered medications for this visit.    Current Outpatient Medications:    Acetaminophen (TYLENOL 8 HOUR PO), Take 500 mg by mouth as needed., Disp: , Rfl:    atorvastatin (LIPITOR) 20 MG tablet, Take 1 tablet (20 mg total) by mouth daily., Disp: 90 tablet, Rfl: 3   Cholecalciferol (VITAMIN D) 50 MCG (2000 UT) tablet, 1 tablet, Disp: , Rfl:    famotidine (PEPCID) 20 MG tablet, Take 1 tablet (20 mg total) by mouth in the morning., Disp: 90 tablet, Rfl: 3   fexofenadine (ALLEGRA) 180 MG tablet, 1 tablet, Disp: , Rfl:    hydrocortisone (ANUSOL-HC) 25 MG suppository, Place 1 suppository (25 mg total) rectally at bedtime., Disp: 12 suppository, Rfl: 1   magnesium chloride (SLOW-MAG) 64 MG TBEC SR tablet, Take 1 tablet by mouth daily., Disp: , Rfl:    Multiple Vitamin (MULTIVITAMIN ADULT PO), , Disp: , Rfl:    Na Sulfate-K Sulfate-Mg Sulf (SUPREP BOWEL PREP KIT) 17.5-3.13-1.6 GM/177ML  SOLN, Take as directed for colonoscopy prep, Disp: 354 mL, Rfl: 0   Pramoxine HCl 1 % CREA, Apply 1 Application topically 3 (three) times daily as needed., Disp: 60 g, Rfl: 0   valsartan-hydrochlorothiazide (DIOVAN-HCT) 160-12.5 MG tablet, Take 1 tablet by mouth daily., Disp: 90 tablet, Rfl: 3 Allergies  Allergen Reactions   Codeine Other (See Comments)    dysphoric    Terbinafine And Related Nausea Only    Flu like symptoms   Family History  Problem Relation Age of Onset   Hypertension Mother    Nephrolithiasis Mother    Cerebral aneurysm Mother 58       died   Hypertension Father     Diabetes Father    Nephrolithiasis Father    Heart attack Father    Prostate cancer Brother    Breast cancer Maternal Grandmother    Esophageal cancer Maternal Grandfather    Colon cancer Neg Hx    Inflammatory bowel disease Neg Hx    Liver disease Neg Hx    Pancreatic cancer Neg Hx    Rectal cancer Neg Hx    Stomach cancer Neg Hx    Social History   Socioeconomic History   Marital status: Married    Spouse name: Not on file   Number of children: 2   Years of education: Not on file   Highest education level: Not on file  Occupational History   Occupation: retired Scientist, clinical (histocompatibility and immunogenetics)  Tobacco Use   Smoking status: Never   Smokeless tobacco: Never  Vaping Use   Vaping status: Never Used  Substance and Sexual Activity   Alcohol use: No   Drug use: No   Sexual activity: Not on file  Other Topics Concern   Not on file  Social History Narrative   Not on file   Social Determinants of Health   Financial Resource Strain: Not on file  Food Insecurity: Not on file  Transportation Needs: Not on file  Physical Activity: Not on file  Stress: Not on file  Social Connections: Not on file  Intimate Partner Violence: Not on file    Physical Exam: There were no vitals filed for this visit. There is no height or weight on file to calculate BMI. GEN: NAD EYE: Sclerae anicteric ENT: MMM CV: Non-tachycardic GI: Soft, NT/ND NEURO:  Alert & Oriented x 3  Lab Results: No results for input(s): "WBC", "HGB", "HCT", "PLT" in the last 72 hours. BMET No results for input(s): "NA", "K", "CL", "CO2", "GLUCOSE", "BUN", "CREATININE", "CALCIUM" in the last 72 hours. LFT No results for input(s): "PROT", "ALBUMIN", "AST", "ALT", "ALKPHOS", "BILITOT", "BILIDIR", "IBILI" in the last 72 hours. PT/INR No results for input(s): "LABPROT", "INR" in the last 72 hours.   Impression / Plan: This is a 72 y.o.male who presents for EGD/Colonoscopy for evaluation of GERD, Colon Cancer screening with incidental  rectal pain and hemorrhoids.  The risks and benefits of endoscopic evaluation/treatment were discussed with the patient and/or family; these include but are not limited to the risk of perforation, infection, bleeding, missed lesions, lack of diagnosis, severe illness requiring hospitalization, as well as anesthesia and sedation related illnesses.  The patient's history has been reviewed, patient examined, no change in status, and deemed stable for procedure.  The patient and/or family is agreeable to proceed.    Corliss Parish, MD  Gastroenterology Advanced Endoscopy Office # [phone]

## 2023-04-08 ENCOUNTER — Telehealth: Payer: Self-pay

## 2023-04-08 MED ORDER — AMBULATORY NON FORMULARY MEDICATION: 0 refills | Status: AC

## 2023-04-08 NOTE — Telephone Encounter (Signed)
-----   Message from [facility] sent at 04/07/2023  4:26 PM EST ----- Regarding: Diltiazem ointment Dillon Arias, Can you send a prescription for Diltiazem ointment as per protocol 2-3 times daily to [facility], in effort of treating rectal pain in case this is fissural pain (even though not noted on today's Colonoscopy)? Thanks. GM

## 2023-04-08 NOTE — Telephone Encounter (Signed)
Prescription for Diltiazem Gel sent to [facility]. Patient has been informed.

## 2023-04-08 NOTE — Telephone Encounter (Signed)
  Follow up Call-     04/07/2023    2:55 PM  Call back number  Post procedure Call Back phone  # [phone]  Permission to leave phone message Yes     Patient questions:  Do you have a fever, pain , or abdominal swelling? No. Pain Score  0 *  Have you tolerated food without any problems? Yes.    Have you been able to return to your normal activities? Yes.    Do you have any questions about your discharge instructions: Diet   No. Medications  No. Follow up visit  No.  Do you have questions or concerns about your Care? No.  Actions: * If pain score is 4 or above: No action needed, pain <4.

## 2023-04-13 LAB — SURGICAL PATHOLOGY

## 2023-04-15 ENCOUNTER — Other Ambulatory Visit: Payer: Self-pay

## 2023-04-15 ENCOUNTER — Encounter: Payer: Self-pay | Admitting: Gastroenterology

## 2023-04-15 DIAGNOSIS — K219 Gastro-esophageal reflux disease without esophagitis: Secondary | ICD-10-CM

## 2023-04-15 DIAGNOSIS — R194 Change in bowel habit: Secondary | ICD-10-CM

## 2023-04-15 DIAGNOSIS — K6289 Other specified diseases of anus and rectum: Secondary | ICD-10-CM

## 2023-04-16 ENCOUNTER — Other Ambulatory Visit (INDEPENDENT_AMBULATORY_CARE_PROVIDER_SITE_OTHER): Payer: Medicare Other

## 2023-04-16 DIAGNOSIS — K219 Gastro-esophageal reflux disease without esophagitis: Secondary | ICD-10-CM | POA: Diagnosis not present

## 2023-04-16 DIAGNOSIS — R194 Change in bowel habit: Secondary | ICD-10-CM | POA: Diagnosis not present

## 2023-04-16 DIAGNOSIS — K6289 Other specified diseases of anus and rectum: Secondary | ICD-10-CM | POA: Diagnosis not present

## 2023-04-16 LAB — CBC WITH DIFFERENTIAL/PLATELET
Basophils Absolute: 0.1 10*3/uL (ref 0.0–0.1)
Basophils Relative: 0.8 % (ref 0.0–3.0)
Eosinophils Absolute: 0.1 10*3/uL (ref 0.0–0.7)
Eosinophils Relative: 2.2 % (ref 0.0–5.0)
HCT: 46.8 % (ref 39.0–52.0)
Hemoglobin: 15.6 g/dL (ref 13.0–17.0)
Lymphocytes Relative: 20.2 % (ref 12.0–46.0)
Lymphs Abs: 1.3 10*3/uL (ref 0.7–4.0)
MCHC: 33.2 g/dL (ref 30.0–36.0)
MCV: 96.2 fL (ref 78.0–100.0)
Monocytes Absolute: 0.8 10*3/uL (ref 0.1–1.0)
Monocytes Relative: 12.3 % — ABNORMAL HIGH (ref 3.0–12.0)
Neutro Abs: 4.3 10*3/uL (ref 1.4–7.7)
Neutrophils Relative %: 64.5 % (ref 43.0–77.0)
Platelets: 202 10*3/uL (ref 150.0–400.0)
RBC: 4.87 Mil/uL (ref 4.22–5.81)
RDW: 13.1 % (ref 11.5–15.5)
WBC: 6.6 10*3/uL (ref 4.0–10.5)

## 2023-04-16 LAB — COMPREHENSIVE METABOLIC PANEL
ALT: 27 U/L (ref 0–53)
AST: 19 U/L (ref 0–37)
Albumin: 4.3 g/dL (ref 3.5–5.2)
Alkaline Phosphatase: 77 U/L (ref 39–117)
BUN: 16 mg/dL (ref 6–23)
CO2: 27 meq/L (ref 19–32)
Calcium: 9 mg/dL (ref 8.4–10.5)
Chloride: 105 meq/L (ref 96–112)
Creatinine, Ser: 0.76 mg/dL (ref 0.40–1.50)
GFR: 89.14 mL/min (ref >60.00)
Glucose, Bld: 121 mg/dL — ABNORMAL HIGH (ref 70–99)
Potassium: 3.7 meq/L (ref 3.5–5.1)
Sodium: 140 meq/L (ref 135–145)
Total Bilirubin: 0.4 mg/dL (ref 0.2–1.2)
Total Protein: 6.9 g/dL (ref 6.0–8.3)

## 2023-04-17 ENCOUNTER — Other Ambulatory Visit: Payer: Self-pay

## 2023-04-18 LAB — IGA: Immunoglobulin A: 94 mg/dL (ref 70–320)

## 2023-04-18 LAB — ANTI-NUCLEAR AB-TITER (ANA TITER): ANA Titer 1: 1:40 {titer} — ABNORMAL HIGH

## 2023-04-18 LAB — ANA: Anti Nuclear Antibody (ANA): POSITIVE — AB

## 2023-04-18 LAB — TISSUE TRANSGLUTAMINASE, IGA: (tTG) Ab, IgA: >250 U/mL — ABNORMAL HIGH

## 2023-04-18 LAB — ANTI-PM/SCL-100 ANTIBODY, EIA

## 2023-04-20 ENCOUNTER — Other Ambulatory Visit: Payer: Self-pay

## 2023-04-20 DIAGNOSIS — K900 Celiac disease: Secondary | ICD-10-CM

## 2023-04-21 ENCOUNTER — Other Ambulatory Visit (HOSPITAL_COMMUNITY): Payer: Self-pay

## 2023-04-23 DIAGNOSIS — R194 Change in bowel habit: Secondary | ICD-10-CM | POA: Diagnosis not present

## 2023-04-23 DIAGNOSIS — B9681 Helicobacter pylori [H. pylori] as the cause of diseases classified elsewhere: Secondary | ICD-10-CM | POA: Diagnosis not present

## 2023-04-28 ENCOUNTER — Encounter: Payer: Self-pay | Admitting: Gastroenterology

## 2023-04-28 NOTE — Progress Notes (Signed)
[facility] H. pylori stool testing  H. pylori negative.  This will be scanned into the chart and the patient will be notified.   Corliss Parish, MD [facility] Office # [phone]

## 2023-05-13 ENCOUNTER — Encounter: Payer: Self-pay | Admitting: Gastroenterology

## 2023-05-18 DIAGNOSIS — L820 Inflamed seborrheic keratosis: Secondary | ICD-10-CM | POA: Diagnosis not present

## 2023-06-10 ENCOUNTER — Other Ambulatory Visit (HOSPITAL_COMMUNITY): Payer: Self-pay

## 2023-06-10 DIAGNOSIS — J329 Chronic sinusitis, unspecified: Secondary | ICD-10-CM | POA: Diagnosis not present

## 2023-06-10 MED ORDER — AMOXICILLIN-POT CLAVULANATE 875-125 MG PO TABS
1.0000 | ORAL_TABLET | Freq: Two times a day (BID) | ORAL | 0 refills | Status: DC
  Filled 2023-06-10: qty 14, 7d supply, fill #0

## 2023-06-12 ENCOUNTER — Other Ambulatory Visit (HOSPITAL_COMMUNITY): Payer: Self-pay

## 2023-06-12 MED ORDER — DOXYCYCLINE HYCLATE 100 MG PO TABS
100.0000 mg | ORAL_TABLET | Freq: Two times a day (BID) | ORAL | 0 refills | Status: AC
  Filled 2023-06-12: qty 14, 7d supply, fill #0

## 2023-06-17 DIAGNOSIS — C61 Malignant neoplasm of prostate: Secondary | ICD-10-CM | POA: Diagnosis not present

## 2023-06-22 ENCOUNTER — Ambulatory Visit: Payer: Medicare Other | Admitting: Dietician

## 2023-06-24 ENCOUNTER — Ambulatory Visit: Payer: Medicare Other | Admitting: Gastroenterology

## 2023-06-24 DIAGNOSIS — H6123 Impacted cerumen, bilateral: Secondary | ICD-10-CM | POA: Diagnosis not present

## 2023-07-03 ENCOUNTER — Other Ambulatory Visit (HOSPITAL_COMMUNITY): Payer: Self-pay

## 2023-07-03 ENCOUNTER — Other Ambulatory Visit: Payer: Self-pay | Admitting: Gastroenterology

## 2023-07-03 MED ORDER — OMEPRAZOLE 40 MG PO CPDR
40.0000 mg | DELAYED_RELEASE_CAPSULE | Freq: Every day | ORAL | 2 refills | Status: DC
  Filled 2023-07-03: qty 30, 30d supply, fill #0
  Filled 2023-08-09: qty 30, 30d supply, fill #1
  Filled 2023-09-05: qty 30, 30d supply, fill #2

## 2023-07-10 ENCOUNTER — Other Ambulatory Visit: Payer: Medicare Other

## 2023-07-10 ENCOUNTER — Ambulatory Visit: Payer: Medicare Other | Admitting: Gastroenterology

## 2023-07-10 ENCOUNTER — Encounter: Payer: Self-pay | Admitting: Gastroenterology

## 2023-07-10 VITALS — BP 118/70 | HR 84 | Ht 67.0 in | Wt 216.0 lb

## 2023-07-10 DIAGNOSIS — K900 Celiac disease: Secondary | ICD-10-CM

## 2023-07-10 DIAGNOSIS — R768 Other specified abnormal immunological findings in serum: Secondary | ICD-10-CM

## 2023-07-10 DIAGNOSIS — K219 Gastro-esophageal reflux disease without esophagitis: Secondary | ICD-10-CM

## 2023-07-10 DIAGNOSIS — K2100 Gastro-esophageal reflux disease with esophagitis, without bleeding: Secondary | ICD-10-CM

## 2023-07-10 NOTE — Patient Instructions (Signed)
 Your provider has requested that you go to the basement level for lab work before leaving today. Press B on the elevator. The lab is located at the first door on the left as you exit the elevator.  Due to recent changes in healthcare laws, you may see the results of your imaging and laboratory studies on MyChart before your provider has had a chance to review them.  We understand that in some cases there may be results that are confusing or concerning to you. Not all laboratory results come back in the same time frame and the provider may be waiting for multiple results in order to interpret others.  Please give us  48 hours in order for your provider to thoroughly review all the results before contacting the office for clarification of your results.   _______________________________________________________  If your blood pressure at your visit was 140/90 or greater, please contact your primary care physician to follow up on this.  _______________________________________________________  If you are age 44 or older, your body mass index should be between 23-30. Your Body mass index is 33.83 kg/m. If this is out of the aforementioned range listed, please consider follow up with your Primary Care Provider.  If you are age 54 or younger, your body mass index should be between 19-25. Your Body mass index is 33.83 kg/m. If this is out of the aformentioned range listed, please consider follow up with your Primary Care Provider.   ________________________________________________________  The [facility] providers would like to encourage you to use MYCHART to communicate with providers for non-urgent requests or questions.  Due to long hold times on the telephone, sending your provider a message by [facility] may be a faster and more efficient way to get a response.  Please allow 48 business hours for a response.  Please remember that this is for non-urgent requests.   _______________________________________________________  Thank you for choosing me and [facility].  Dr. Wilhelmenia

## 2023-07-12 ENCOUNTER — Encounter: Payer: Self-pay | Admitting: Gastroenterology

## 2023-07-12 DIAGNOSIS — R768 Other specified abnormal immunological findings in serum: Secondary | ICD-10-CM | POA: Insufficient documentation

## 2023-07-12 DIAGNOSIS — K900 Celiac disease: Secondary | ICD-10-CM | POA: Insufficient documentation

## 2023-07-12 LAB — TISSUE TRANSGLUTAMINASE, IGA: (tTG) Ab, IgA: 19.9 U/mL — ABNORMAL HIGH

## 2023-07-12 LAB — IGA: Immunoglobulin A: 108 mg/dL (ref 70–320)

## 2023-07-12 NOTE — Progress Notes (Signed)
 GASTROENTEROLOGY OUTPATIENT CLINIC VISIT   Primary Care Provider Charlott Dorn LABOR, MD [address] [place] [address] [phone]   Patient Profile: Dillon Arias is a 73 y.o. male with a pmh significant for hypertension, hyperlipidemia, nephrolithiasis, prostate cancer (status post prostatectomy + radiation), GERD, diverticulosis, Celiac Disease.  The patient presents to the [facility] for an evaluation and management of problem(s) noted below:  Problem List 1. Celiac disease   2. Elevated anti-tissue transglutaminase (tTG) IgA level   3. Gastroesophageal reflux disease with esophagitis without hemorrhage    Discussed the use of AI scribe software for clinical note transcription with the patient, who gave verbal consent to proceed.   History of Present Illness The patient returns for follow-up.  I last saw the patient at time of his EGD/Colonoscopy with diagnosis of Celiac disease.  He has since been on a GFD and overall has had symptom improvement.  He no longer experiences epigastric pressure or discomfort, and bowel movements have normalized to once daily without any blood. He describes feeling 'really great' and notes a 'day and night difference' in his condition.  We have not rechecked TTG levels since his GFD initiation.  Only 1 granddaughter has been diagnosed with Celiac otherwise; the rest of his first-degree relatives have been tested and are negative for the disorder.  He did not visit the GFD dietician due to the high cost of the consultation but has been managing his diet with the help of his wife and his daughter.  He has become adept at reading food labels to ensure compliance with the gluten-free diet.   He continues to take a proton pump inhibitor (PPI) once daily and Pepcid  in the evening to manage esophagitis symptoms but has no pyrosis symptoms or indigestion symptoms.    GI Review of Systems Positive as above Negative  for dysphagia, nausea, vomiting, melena, hematochezia  Review of Systems General: Denies fevers/chills/weight loss unintentionally Cardiovascular: Denies chest pain Pulmonary: Denies shortness of breath Gastroenterological: See HPI Genitourinary: Denies darkened urine  Hematological: Denies easy bruising/bleeding Dermatological: Denies jaundice Psychological: Mood is stable   Medications Current Outpatient Medications  Medication Sig Dispense Refill   Acetaminophen  (TYLENOL  8 HOUR PO) Take 500 mg by mouth as needed.     AMBULATORY NON FORMULARY MEDICATION Medication Name: Diltiazem 2%/ Lidocaine  5% Apply pea size amount 1/2 to 1 inch inside rectum 3-4 times daily 30 g 0   atorvastatin  (LIPITOR) 20 MG tablet Take 1 tablet (20 mg total) by mouth daily. 90 tablet 3   Cholecalciferol  (VITAMIN D) 50 MCG (2000 UT) tablet 1 tablet     famotidine  (PEPCID ) 20 MG tablet Take 1 tablet (20 mg total) by mouth in the morning. 90 tablet 3   fexofenadine (ALLEGRA) 180 MG tablet 1 tablet     Multiple Vitamin (MULTIVITAMIN ADULT PO)      omeprazole  (PRILOSEC) 40 MG capsule Take 1 capsule (40 mg total) by mouth daily. 30 capsule 2   valsartan -hydrochlorothiazide  (DIOVAN -HCT) 160-12.5 MG tablet Take 1 tablet by mouth daily. 90 tablet 3   amoxicillin -clavulanate (AUGMENTIN ) 875-125 MG tablet Take 1 tablet by mouth every 12 (twelve) hours for 7 days. (Patient not taking: Reported on 07/10/2023) 14 tablet 0   No current facility-administered medications for this visit.    Allergies Allergies  Allergen Reactions   Codeine Other (See Comments)    dysphoric    Terbinafine And Related Nausea Only    Flu like symptoms  Histories Past Medical History:  Diagnosis Date   GERD (gastroesophageal reflux disease)    H/O renal calculi    History of kidney stones 2008   Hyperlipidemia    Hypertension    Prostate cancer [facility])    Past Surgical History:  Procedure Laterality Date   COLONOSCOPY WITH  PROPOFOL  N/A 01/22/2015   Procedure: COLONOSCOPY WITH PROPOFOL ;  Surgeon: Gladis MARLA Louder, MD;  Location: [facility];  Service: Endoscopy;  Laterality: N/A;   EXTRACORPOREAL SHOCK WAVE LITHOTRIPSY     INGUINAL HERNIA REPAIR Left    in college   KIDNEY STONE SURGERY     LYMPHADENECTOMY Bilateral 02/10/2019   Procedure: LYMPHADENECTOMY, PELVIC;  Surgeon: Renda Glance, MD;  Location: [facility];  Service: Urology;  Laterality: Bilateral;   PILONIDAL CYST EXCISION     PTOSIS REPAIR     age 73   ROBOT ASSISTED LAPAROSCOPIC RADICAL PROSTATECTOMY N/A 02/10/2019   Procedure: XI ROBOTIC ASSISTED LAPAROSCOPIC RADICAL PROSTATECTOMY LEVEL 2;  Surgeon: Renda Glance, MD;  Location: [facility];  Service: Urology;  Laterality: N/A;  ONLY NEEDS 210 MIN FOR ALL PROCEDURES   Social History   Socioeconomic History   Marital status: Married    Spouse name: Not on file   Number of children: 2   Years of education: Not on file   Highest education level: Not on file  Occupational History   Occupation: retired SCIENTIST, CLINICAL (HISTOCOMPATIBILITY AND IMMUNOGENETICS)  Tobacco Use   Smoking status: Never   Smokeless tobacco: Never  Vaping Use   Vaping status: Never Used  Substance and Sexual Activity   Alcohol use: No   Drug use: No   Sexual activity: Not on file  Other Topics Concern   Not on file  Social History Narrative   Not on file   Social Drivers of Health   Financial Resource Strain: Not on file  Food Insecurity: Not on file  Transportation Needs: Not on file  Physical Activity: Not on file  Stress: Not on file  Social Connections: Not on file  Intimate Partner Violence: Not on file   Family History  Problem Relation Age of Onset   Hypertension Mother    Nephrolithiasis Mother    Cerebral aneurysm Mother 54       died   Hypertension Father    Diabetes Father    Nephrolithiasis Father    Heart attack Father    Prostate cancer Brother    Breast cancer Maternal Grandmother    Esophageal cancer Maternal Grandfather    Colon cancer  Neg Hx    Inflammatory bowel disease Neg Hx    Liver disease Neg Hx    Pancreatic cancer Neg Hx    Rectal cancer Neg Hx    Stomach cancer Neg Hx    I have reviewed his medical, social, and family history in detail and updated the electronic medical record as necessary.    PHYSICAL EXAMINATION  BP 118/70   Pulse 84   Ht 5' 7 (1.702 m)   Wt 216 lb (98 kg)   BMI 33.83 kg/m  Wt Readings from Last 3 Encounters:  07/10/23 216 lb (98 kg)  04/07/23 209 lb (94.8 kg)  02/27/23 209 lb (94.8 kg)  GEN: NAD, appears stated age, doesn't appear chronically ill PSYCH: Cooperative, without pressured speech EYE: Conjunctivae pink, sclerae anicteric ENT: MMM CV: Nontachycardic RESP: No audible wheezing GI: NABS, soft, protuberant abdomen, rounded, NT, without rebound or guarding, no HSM appreciated MSK/EXT: No significant lower extremity edema SKIN: No jaundice  NEURO:  Alert & Oriented x 3, no focal deficit   REVIEW OF DATA  I reviewed the following data at the time of this encounter:  GI Procedures and Studies  2024 EGD - No gross lesions in the proximal esophagus and in the mid esophagus. - LA Grade A esophagitis with no bleeding. - Z-line irregular, 37 cm from the incisors. - Multiple gastric polyps (consistent with fundic gland polyps). - Antral gastritis. No other gross lesions in the entire stomach. Biopsied. - Duodenitis. Biopsied.   2024 Colonoscopy - Hemorrhoids found on digital rectal exam. - The examined portion of the ileum was normal. - Diverticulosis in the recto-sigmoid colon and in the sigmoid colon. - Normal mucosa in the entire examined colon otherwise. - Non-bleeding non-thrombosed external and internal hemorrhoids.  Pathology FINAL DIAGNOSIS      1. Surgical [P], random gastric :      - MILD REACTIVE GASTROPATHY.      - NEGATIVE FOR H. PYLORI ON H&E STAIN.  SEE NOTE.      - NO INTESTINAL METAPLASIA, DYSPLASIA, OR MALIGNANCY.      2. Surgical [P], duodenal :      -  DUODENAL MUCOSA WITH INCREASED INTRAEPITHELIAL LYMPHOCYTES AND MILD VILLOUS      BLUNTING.SEE NOTE. MICROSCOPIC DESCRIPTION 2. These findings are nonspecific and have been described in association with a variety of conditions including mild celiac disease, other food sensitivities, certain infections (including Helicobacter gastritis), small intestinal bacterial overgrowth, certain medications (including NSAIDs), peptic injury, inflammatory bowel disease, immunodeficiencies, and systemic autoimmune disorders.Clinical and endoscopic correlation is recommended.   Laboratory Studies  Reviewed those in epic  Imaging Studies  No relevant studies to review   ASSESSMENT  Dillon Arias is a 73 y.o. male with a pmh significant for hypertension, hyperlipidemia, nephrolithiasis, prostate cancer (status post prostatectomy + radiation), GERD, diverticulosis, Celiac Disease.  The patient is seen today for evaluation and management of:  1. Celiac disease   2. Elevated anti-tissue transglutaminase (tTG) IgA level   3. Gastroesophageal reflux disease with esophagitis without hemorrhage    The patient is clinically and hemodynamically stable.  Overall, doing well after diagnosis of Celiac while on GFD.  Will plan to see his TTG levels and when they have normalize, or close to normalizing will plan repeat EGD with sampling.  This can take months to achieve however, but each patient is different.  No need for VCE currently.  Will keep current PPI dosing until EGD is repeated.  All patient questions were answered to the best of my ability, and the patient agrees to the aforementioned plan of action with follow-up as indicated.   PLAN  Continue GFD Will recheck TTG level -When near normal then plan for repeat EGD with sampling Continue famotidine  1-2 times daily as needed Continue PPI 40 mg daily -Try to decrease after repeat EGD is completed to ensure esophagitis treated   Orders Placed This Encounter   Procedures   IgA   Tissue transglutaminase, IgA    New Prescriptions   No medications on file   Modified Medications   No medications on file    Planned Follow Up No follow-ups on file.   Total Time in Face-to-Face and in Coordination of Care for patient including independent/personal interpretation/review of prior testing, medical history, examination, medication adjustment, communicating results with the patient directly, and documentation within the EHR is 25 minutes.   Aloha Finner, MD [facility] Office # [phone]

## 2023-07-13 ENCOUNTER — Other Ambulatory Visit: Payer: Self-pay

## 2023-07-13 ENCOUNTER — Encounter: Payer: Self-pay | Admitting: Gastroenterology

## 2023-07-13 DIAGNOSIS — K900 Celiac disease: Secondary | ICD-10-CM

## 2023-07-13 DIAGNOSIS — R768 Other specified abnormal immunological findings in serum: Secondary | ICD-10-CM

## 2023-07-15 ENCOUNTER — Other Ambulatory Visit (HOSPITAL_COMMUNITY): Payer: Self-pay

## 2023-07-15 MED ORDER — ATORVASTATIN CALCIUM 20 MG PO TABS
20.0000 mg | ORAL_TABLET | Freq: Every day | ORAL | 3 refills | Status: AC
  Filled 2023-07-15: qty 90, 90d supply, fill #0
  Filled 2023-12-07: qty 90, 90d supply, fill #1
  Filled 2024-03-04: qty 90, 90d supply, fill #2
  Filled 2024-06-01: qty 90, 90d supply, fill #3

## 2023-07-15 MED ORDER — VALSARTAN-HYDROCHLOROTHIAZIDE 160-12.5 MG PO TABS
1.0000 | ORAL_TABLET | Freq: Every day | ORAL | 3 refills | Status: AC
  Filled 2023-07-15: qty 90, 90d supply, fill #0
  Filled 2023-11-15: qty 90, 90d supply, fill #1
  Filled 2024-02-10: qty 90, 90d supply, fill #2
  Filled 2024-05-12: qty 90, 90d supply, fill #3

## 2023-07-16 ENCOUNTER — Other Ambulatory Visit (HOSPITAL_COMMUNITY): Payer: Self-pay

## 2023-07-21 DIAGNOSIS — D485 Neoplasm of uncertain behavior of skin: Secondary | ICD-10-CM | POA: Diagnosis not present

## 2023-07-21 DIAGNOSIS — C44229 Squamous cell carcinoma of skin of left ear and external auricular canal: Secondary | ICD-10-CM | POA: Diagnosis not present

## 2023-08-12 DIAGNOSIS — Z1331 Encounter for screening for depression: Secondary | ICD-10-CM | POA: Diagnosis not present

## 2023-08-12 DIAGNOSIS — Z0000 Encounter for general adult medical examination without abnormal findings: Secondary | ICD-10-CM | POA: Diagnosis not present

## 2023-08-12 DIAGNOSIS — E782 Mixed hyperlipidemia: Secondary | ICD-10-CM | POA: Diagnosis not present

## 2023-08-12 DIAGNOSIS — Z79899 Other long term (current) drug therapy: Secondary | ICD-10-CM | POA: Diagnosis not present

## 2023-08-12 DIAGNOSIS — I10 Essential (primary) hypertension: Secondary | ICD-10-CM | POA: Diagnosis not present

## 2023-08-12 DIAGNOSIS — E559 Vitamin D deficiency, unspecified: Secondary | ICD-10-CM | POA: Diagnosis not present

## 2023-08-12 DIAGNOSIS — K909 Intestinal malabsorption, unspecified: Secondary | ICD-10-CM | POA: Diagnosis not present

## 2023-08-12 DIAGNOSIS — C61 Malignant neoplasm of prostate: Secondary | ICD-10-CM | POA: Diagnosis not present

## 2023-08-12 DIAGNOSIS — E1165 Type 2 diabetes mellitus with hyperglycemia: Secondary | ICD-10-CM | POA: Diagnosis not present

## 2023-08-18 ENCOUNTER — Other Ambulatory Visit (HOSPITAL_COMMUNITY): Payer: Self-pay

## 2023-08-18 MED ORDER — EZETIMIBE 10 MG PO TABS
10.0000 mg | ORAL_TABLET | Freq: Every day | ORAL | 0 refills | Status: DC
  Filled 2023-08-18: qty 90, 90d supply, fill #0

## 2023-09-08 ENCOUNTER — Other Ambulatory Visit (HOSPITAL_COMMUNITY): Payer: Self-pay

## 2023-09-10 ENCOUNTER — Other Ambulatory Visit

## 2023-09-10 ENCOUNTER — Telehealth: Payer: Self-pay

## 2023-09-10 DIAGNOSIS — K900 Celiac disease: Secondary | ICD-10-CM

## 2023-09-10 DIAGNOSIS — R768 Other specified abnormal immunological findings in serum: Secondary | ICD-10-CM | POA: Diagnosis not present

## 2023-09-10 NOTE — Telephone Encounter (Signed)
 The labs have been collected. The pt will be called as soon as the results are available.

## 2023-09-10 NOTE — Telephone Encounter (Signed)
-----   Message from Nurse Eylin Pontarelli P sent at 07/13/2023 11:39 AM EST -----  2 months for TTG and IgA level to evaluate where things are.

## 2023-09-11 LAB — IGA: Immunoglobulin A: 90 mg/dL (ref 70–320)

## 2023-09-11 LAB — TISSUE TRANSGLUTAMINASE, IGA: (tTG) Ab, IgA: 5.7 U/mL

## 2023-09-14 ENCOUNTER — Encounter: Payer: Self-pay | Admitting: Gastroenterology

## 2023-09-17 ENCOUNTER — Other Ambulatory Visit: Payer: Self-pay

## 2023-09-17 DIAGNOSIS — K2100 Gastro-esophageal reflux disease with esophagitis, without bleeding: Secondary | ICD-10-CM

## 2023-09-17 DIAGNOSIS — K900 Celiac disease: Secondary | ICD-10-CM

## 2023-09-17 DIAGNOSIS — R768 Other specified abnormal immunological findings in serum: Secondary | ICD-10-CM

## 2023-09-30 DIAGNOSIS — K080 Exfoliation of teeth due to systemic causes: Secondary | ICD-10-CM | POA: Diagnosis not present

## 2023-10-04 ENCOUNTER — Other Ambulatory Visit: Payer: Self-pay | Admitting: Gastroenterology

## 2023-10-05 ENCOUNTER — Other Ambulatory Visit (HOSPITAL_COMMUNITY): Payer: Self-pay

## 2023-10-05 MED ORDER — OMEPRAZOLE 40 MG PO CPDR
40.0000 mg | DELAYED_RELEASE_CAPSULE | Freq: Every day | ORAL | 2 refills | Status: DC
  Filled 2023-10-05: qty 30, 30d supply, fill #0
  Filled 2023-11-11: qty 30, 30d supply, fill #1
  Filled 2023-12-20: qty 30, 30d supply, fill #2

## 2023-10-12 ENCOUNTER — Other Ambulatory Visit (HOSPITAL_COMMUNITY): Payer: Self-pay

## 2023-10-12 MED ORDER — FAMOTIDINE 20 MG PO TABS
20.0000 mg | ORAL_TABLET | Freq: Every morning | ORAL | 3 refills | Status: DC
  Filled 2023-10-12: qty 90, 90d supply, fill #0

## 2023-10-16 DIAGNOSIS — E782 Mixed hyperlipidemia: Secondary | ICD-10-CM | POA: Diagnosis not present

## 2023-11-11 ENCOUNTER — Encounter: Payer: Self-pay | Admitting: Gastroenterology

## 2023-11-11 ENCOUNTER — Ambulatory Visit (AMBULATORY_SURGERY_CENTER): Admitting: Gastroenterology

## 2023-11-11 ENCOUNTER — Other Ambulatory Visit (HOSPITAL_COMMUNITY): Payer: Self-pay

## 2023-11-11 VITALS — BP 130/75 | HR 61 | Temp 98.0°F | Resp 11 | Ht 67.0 in | Wt 216.0 lb

## 2023-11-11 DIAGNOSIS — K8681 Exocrine pancreatic insufficiency: Secondary | ICD-10-CM | POA: Diagnosis not present

## 2023-11-11 DIAGNOSIS — K449 Diaphragmatic hernia without obstruction or gangrene: Secondary | ICD-10-CM

## 2023-11-11 DIAGNOSIS — K900 Celiac disease: Secondary | ICD-10-CM

## 2023-11-11 DIAGNOSIS — Z8719 Personal history of other diseases of the digestive system: Secondary | ICD-10-CM

## 2023-11-11 DIAGNOSIS — K2289 Other specified disease of esophagus: Secondary | ICD-10-CM

## 2023-11-11 MED ORDER — SODIUM CHLORIDE 0.9 % IV SOLN: 500.0000 mL | INTRAVENOUS | Status: DC

## 2023-11-11 NOTE — Progress Notes (Signed)
 Report given to PACU, vss

## 2023-11-11 NOTE — Progress Notes (Signed)
 GASTROENTEROLOGY PROCEDURE H&P NOTE   Primary Care Physician: Benedetta Bradley, MD  HPI: Dillon Arias is a 73 y.o. male who presents for EGD for evaluation of recently diagnosed Celiac disease and improving TTG to evaluate SB.  Past Medical History:  Diagnosis Date   GERD (gastroesophageal reflux disease)    H/O renal calculi    History of kidney stones 2008   Hyperlipidemia    Hypertension    Prostate cancer [facility])    Past Surgical History:  Procedure Laterality Date   COLONOSCOPY WITH PROPOFOL  N/A 01/22/2015   Procedure: COLONOSCOPY WITH PROPOFOL ;  Surgeon: Garrett Kallman, MD;  Location: [facility];  Service: Endoscopy;  Laterality: N/A;   EXTRACORPOREAL SHOCK WAVE LITHOTRIPSY     INGUINAL HERNIA REPAIR Left    in college   KIDNEY STONE SURGERY     LYMPHADENECTOMY Bilateral 02/10/2019   Procedure: LYMPHADENECTOMY, PELVIC;  Surgeon: Florencio Hunting, MD;  Location: [facility];  Service: Urology;  Laterality: Bilateral;   PILONIDAL CYST EXCISION     PTOSIS REPAIR     age 52   ROBOT ASSISTED LAPAROSCOPIC RADICAL PROSTATECTOMY N/A 02/10/2019   Procedure: XI ROBOTIC ASSISTED LAPAROSCOPIC RADICAL PROSTATECTOMY LEVEL 2;  Surgeon: Florencio Hunting, MD;  Location: [facility];  Service: Urology;  Laterality: N/A;  ONLY NEEDS 210 MIN FOR ALL PROCEDURES   Current Outpatient Medications  Medication Sig Dispense Refill   atorvastatin  (LIPITOR) 20 MG tablet Take 1 tablet (20 mg total) by mouth daily. 90 tablet 3   Cholecalciferol  (VITAMIN D) 50 MCG (2000 UT) tablet 1 tablet     ezetimibe  (ZETIA ) 10 MG tablet Take 1 tablet (10 mg total) by mouth daily. 90 tablet 0   fexofenadine (ALLEGRA) 180 MG tablet 1 tablet     Multiple Vitamin (MULTIVITAMIN ADULT PO)      omeprazole  (PRILOSEC) 40 MG capsule Take 1 capsule (40 mg total) by mouth daily. 30 capsule 2   valsartan -hydrochlorothiazide  (DIOVAN -HCT) 160-12.5 MG tablet Take 1 tablet by mouth daily. 90 tablet 3   Acetaminophen  (TYLENOL  8  HOUR PO) Take 500 mg by mouth as needed.     AMBULATORY NON FORMULARY MEDICATION Medication Name: Diltiazem 2%/ Lidocaine  5% Apply pea size amount 1/2 to 1 inch inside rectum 3-4 times daily 30 g 0   Current Facility-Administered Medications  Medication Dose Route Frequency Provider Last Rate Last Admin   0.9 %  sodium chloride  infusion  500 mL Intravenous Continuous Mansouraty, Orlanda Lemmerman Jr., MD        Current Outpatient Medications:    atorvastatin  (LIPITOR) 20 MG tablet, Take 1 tablet (20 mg total) by mouth daily., Disp: 90 tablet, Rfl: 3   Cholecalciferol  (VITAMIN D) 50 MCG (2000 UT) tablet, 1 tablet, Disp: , Rfl:    ezetimibe  (ZETIA ) 10 MG tablet, Take 1 tablet (10 mg total) by mouth daily., Disp: 90 tablet, Rfl: 0   fexofenadine (ALLEGRA) 180 MG tablet, 1 tablet, Disp: , Rfl:    Multiple Vitamin (MULTIVITAMIN ADULT PO), , Disp: , Rfl:    omeprazole  (PRILOSEC) 40 MG capsule, Take 1 capsule (40 mg total) by mouth daily., Disp: 30 capsule, Rfl: 2   valsartan -hydrochlorothiazide  (DIOVAN -HCT) 160-12.5 MG tablet, Take 1 tablet by mouth daily., Disp: 90 tablet, Rfl: 3   Acetaminophen  (TYLENOL  8 HOUR PO), Take 500 mg by mouth as needed., Disp: , Rfl:    AMBULATORY NON FORMULARY MEDICATION, Medication Name: Diltiazem 2%/ Lidocaine  5% Apply pea size amount 1/2 to 1 inch inside rectum 3-4  times daily, Disp: 30 g, Rfl: 0  Current Facility-Administered Medications:    0.9 %  sodium chloride  infusion, 500 mL, Intravenous, Continuous, Mansouraty, Albino Alu., MD Allergies  Allergen Reactions   Codeine Other (See Comments)    dysphoric    Terbinafine And Related Nausea Only    Flu like symptoms   Family History  Problem Relation Age of Onset   Hypertension Mother    Nephrolithiasis Mother    Cerebral aneurysm Mother 15       died   Hypertension Father    Diabetes Father    Nephrolithiasis Father    Heart attack Father    Prostate cancer Brother    Breast cancer Maternal Grandmother     Esophageal cancer Maternal Grandfather    Colon cancer Neg Hx    Inflammatory bowel disease Neg Hx    Liver disease Neg Hx    Pancreatic cancer Neg Hx    Rectal cancer Neg Hx    Stomach cancer Neg Hx    Social History   Socioeconomic History   Marital status: Married    Spouse name: Not on file   Number of children: 2   Years of education: Not on file   Highest education level: Not on file  Occupational History   Occupation: retired Scientist, clinical (histocompatibility and immunogenetics)  Tobacco Use   Smoking status: Never   Smokeless tobacco: Never  Vaping Use   Vaping status: Never Used  Substance and Sexual Activity   Alcohol use: No   Drug use: No   Sexual activity: Not on file  Other Topics Concern   Not on file  Social History Narrative   Not on file   Social Drivers of Health   Financial Resource Strain: Not on file  Food Insecurity: Not on file  Transportation Needs: Not on file  Physical Activity: Not on file  Stress: Not on file  Social Connections: Not on file  Intimate Partner Violence: Not on file    Physical Exam: Today's Vitals   11/11/23 0818 11/11/23 0819  BP: (!) 141/76   Pulse: 63   Temp: 98 F (36.7 C) 98 F (36.7 C)  SpO2: 98%   Weight: 216 lb (98 kg)   Height: 5' 7 (1.702 m)    Body mass index is 33.83 kg/m. GEN: NAD EYE: Sclerae anicteric ENT: MMM CV: Non-tachycardic GI: Soft, NT/ND NEURO:  Alert & Oriented x 3  Lab Results: No results for input(s): WBC, HGB, HCT, PLT in the last 72 hours. BMET No results for input(s): NA, K, CL, CO2, GLUCOSE, BUN, CREATININE, CALCIUM  in the last 72 hours. LFT No results for input(s): PROT, ALBUMIN, AST, ALT, ALKPHOS, BILITOT, BILIDIR, IBILI in the last 72 hours. PT/INR No results for input(s): LABPROT, INR in the last 72 hours.   Impression / Plan: This is a 73 y.o.male who presents for EGD for evaluation of recently diagnosed Celiac disease and improving TTG to evaluate SB.  The risks  and benefits of endoscopic evaluation/treatment were discussed with the patient and/or family; these include but are not limited to the risk of perforation, infection, bleeding, missed lesions, lack of diagnosis, severe illness requiring hospitalization, as well as anesthesia and sedation related illnesses.  The patient's history has been reviewed, patient examined, no change in status, and deemed stable for procedure.  The patient and/or family is agreeable to proceed.    Yong Henle, MD [facility] Office # [phone]

## 2023-11-11 NOTE — Progress Notes (Signed)
 Called to room to assist during endoscopic procedure.  Patient ID and intended procedure confirmed with present staff. Received instructions for my participation in the procedure from the performing physician.

## 2023-11-11 NOTE — Progress Notes (Signed)
 Pt's states no medical or surgical changes since previsit or office visit.

## 2023-11-11 NOTE — Progress Notes (Signed)
 Robinul 0.1 mg IV given due large amount of secretions upon assessment.  MD made aware, vss

## 2023-11-11 NOTE — Op Note (Signed)
 [facility] Patient Name: Dillon Arias Procedure Date: 11/11/2023 9:11 AM MRN: [id number] Endoscopist: Yong Henle , MD, [id number] Age: 73 Referring MD:  Date of Birth: 24-Jul-1950 Gender: Male Account #: [id number] Procedure:                Upper GI endoscopy Indications:              Celiac disease, Follow-up of celiac disease,                            Positive celiac serologies Medicines:                Monitored Anesthesia Care Procedure:                Pre-Anesthesia Assessment:                           - Prior to the procedure, a History and Physical                            was performed, and patient medications and                            allergies were reviewed. The patient's tolerance of                            previous anesthesia was also reviewed. The risks                            and benefits of the procedure and the sedation                            options and risks were discussed with the patient.                            All questions were answered, and informed consent                            was obtained. Prior Anticoagulants: The patient has                            taken no anticoagulant or antiplatelet agents. ASA                            Grade Assessment: II - A patient with mild systemic                            disease. After reviewing the risks and benefits,                            the patient was deemed in satisfactory condition to                            undergo the procedure.  After obtaining informed consent, the endoscope was                            passed under direct vision. Throughout the                            procedure, the patient's blood pressure, pulse, and                            oxygen saturations were monitored continuously. The                            Olympus Scope [id number] was introduced through the                            mouth, and advanced to  the fourth part of duodenum.                            The upper GI endoscopy was accomplished without                            difficulty. The patient tolerated the procedure. Scope In: Scope Out: Findings:                 No gross lesions were noted in the entire esophagus.                           The Z-line was irregular and was found 39 cm from                            the incisors.                           A 1 cm hiatal hernia was present.                           No gross lesions were noted in the entire examined                            stomach.                           Normal mucosa was found in the entire duodenum.                            Biopsies for histology were taken with a cold                            forceps for evaluation of celiac disease. Complications:            No immediate complications. Estimated Blood Loss:     Estimated blood loss was minimal. Impression:               - No gross lesions in the entire esophagus. Z-line  irregular, 39 cm from the incisors.                           - 1 cm hiatal hernia.                           - No gross lesions in the entire stomach.                           - Normal mucosa was found in the entire examined                            duodenum. Biopsied. Recommendation:           - The patient will be observed post-procedure,                            until all discharge criteria are met.                           - Discharge patient to home.                           - Patient has a contact number available for                            emergencies. The signs and symptoms of potential                            delayed complications were discussed with the                            patient. Return to normal activities tomorrow.                            Written discharge instructions were provided to the                            patient.                           - Gluten  free diet.                           - Await pathology results.                           - Repeat upper endoscopy for surveillance based on                            pathology results.                           - The findings and recommendations were discussed                            with the patient.                           -  The findings and recommendations were discussed                            with the patient's family. Yong Henle, MD 11/11/2023 9:40:01 AM

## 2023-11-11 NOTE — Patient Instructions (Signed)
 Resume Gluten free diet and medications.  Biopsy results will be sent via MyChart or letter.    YOU HAD AN ENDOSCOPIC PROCEDURE TODAY AT THE [facility]:   Refer to the procedure report that was given to you for any specific questions about what was found during the examination.  If the procedure report does not answer your questions, please call your gastroenterologist to clarify.  If you requested that your care partner not be given the details of your procedure findings, then the procedure report has been included in a sealed envelope for you to review at your convenience later.  YOU SHOULD EXPECT: Some feelings of bloating in the abdomen. Passage of more gas than usual.  Walking can help get rid of the air that was put into your GI tract during the procedure and reduce the bloating. If you had a lower endoscopy (such as a colonoscopy or flexible sigmoidoscopy) you may notice spotting of blood in your stool or on the toilet paper. If you underwent a bowel prep for your procedure, you may not have a normal bowel movement for a few days.  Please Note:  You might notice some irritation and congestion in your nose or some drainage.  This is from the oxygen used during your procedure.  There is no need for concern and it should clear up in a day or so.  SYMPTOMS TO REPORT IMMEDIATELY:  Following upper endoscopy (EGD)  Vomiting of blood or coffee ground material  New chest pain or pain under the shoulder blades  Painful or persistently difficult swallowing  New shortness of breath  Fever of 100F or higher  Black, tarry-looking stools  For urgent or emergent issues, a gastroenterologist can be reached at any hour by calling [phone]. Do not use MyChart messaging for urgent concerns.    DIET:  We do recommend a small meal at first, but then you may proceed to your regulat diet - Gluten free.  Drink plenty of fluids but you should avoid alcoholic beverages for 24  hours.  ACTIVITY:  You should plan to take it easy for the rest of today and you should NOT DRIVE or use heavy machinery until tomorrow (because of the sedation medicines used during the test).    FOLLOW UP: Our staff will call the number listed on your records the next business day following your procedure.  We will call around 7:15- 8:00 am to check on you and address any questions or concerns that you may have regarding the information given to you following your procedure. If we do not reach you, we will leave a message.     If any biopsies were taken you will be contacted by phone or by letter within the next 1-3 weeks.  Please call us  at [phone] if you have not heard about the biopsies in 3 weeks.    SIGNATURES/CONFIDENTIALITY: You and/or your care partner have signed paperwork which will be entered into your electronic medical record.  These signatures attest to the fact that that the information above on your After Visit Summary has been reviewed and is understood.  Full responsibility of the confidentiality of this discharge information lies with you and/or your care-partner.

## 2023-11-12 ENCOUNTER — Telehealth: Payer: Self-pay

## 2023-11-12 ENCOUNTER — Other Ambulatory Visit (HOSPITAL_COMMUNITY): Payer: Self-pay

## 2023-11-12 MED ORDER — EZETIMIBE 10 MG PO TABS
10.0000 mg | ORAL_TABLET | Freq: Every day | ORAL | 0 refills | Status: DC
  Filled 2023-11-12: qty 90, 90d supply, fill #0

## 2023-11-12 NOTE — Telephone Encounter (Signed)
  Follow up Call-     11/11/2023    8:19 AM 04/07/2023    2:55 PM  Call back number  Post procedure Call Back phone  # [phone]  Permission to leave phone message Yes Yes     Patient questions:  Do you have a fever, pain , or abdominal swelling? No. Pain Score  0 *  Have you tolerated food without any problems? Yes.    Have you been able to return to your normal activities? Yes.    Do you have any questions about your discharge instructions: Diet   No. Medications  No. Follow up visit  No.  Do you have questions or concerns about your Care? No.  Actions: * If pain score is 4 or above: No action needed, pain <4.

## 2023-11-13 ENCOUNTER — Other Ambulatory Visit (HOSPITAL_COMMUNITY): Payer: Self-pay

## 2023-11-14 ENCOUNTER — Ambulatory Visit: Payer: Self-pay | Admitting: Gastroenterology

## 2023-11-30 DIAGNOSIS — K080 Exfoliation of teeth due to systemic causes: Secondary | ICD-10-CM | POA: Diagnosis not present

## 2023-12-07 ENCOUNTER — Other Ambulatory Visit (HOSPITAL_COMMUNITY): Payer: Self-pay

## 2023-12-07 ENCOUNTER — Other Ambulatory Visit: Payer: Self-pay

## 2023-12-08 ENCOUNTER — Other Ambulatory Visit (HOSPITAL_COMMUNITY): Payer: Self-pay

## 2023-12-08 ENCOUNTER — Encounter (HOSPITAL_COMMUNITY): Payer: Self-pay

## 2023-12-23 DIAGNOSIS — M65321 Trigger finger, right index finger: Secondary | ICD-10-CM | POA: Diagnosis not present

## 2024-01-15 ENCOUNTER — Other Ambulatory Visit (HOSPITAL_COMMUNITY): Payer: Self-pay

## 2024-01-15 ENCOUNTER — Other Ambulatory Visit: Payer: Self-pay | Admitting: Gastroenterology

## 2024-01-15 MED ORDER — OMEPRAZOLE 40 MG PO CPDR
40.0000 mg | DELAYED_RELEASE_CAPSULE | Freq: Every day | ORAL | 2 refills | Status: DC
  Filled 2024-01-26: qty 30, 30d supply, fill #0
  Filled 2024-02-23: qty 30, 30d supply, fill #1
  Filled 2024-04-03: qty 30, 30d supply, fill #2

## 2024-01-26 ENCOUNTER — Other Ambulatory Visit (HOSPITAL_COMMUNITY): Payer: Self-pay

## 2024-02-04 DIAGNOSIS — L814 Other melanin hyperpigmentation: Secondary | ICD-10-CM | POA: Diagnosis not present

## 2024-02-04 DIAGNOSIS — L578 Other skin changes due to chronic exposure to nonionizing radiation: Secondary | ICD-10-CM | POA: Diagnosis not present

## 2024-02-04 DIAGNOSIS — L821 Other seborrheic keratosis: Secondary | ICD-10-CM | POA: Diagnosis not present

## 2024-02-04 DIAGNOSIS — D225 Melanocytic nevi of trunk: Secondary | ICD-10-CM | POA: Diagnosis not present

## 2024-02-04 DIAGNOSIS — L570 Actinic keratosis: Secondary | ICD-10-CM | POA: Diagnosis not present

## 2024-02-04 DIAGNOSIS — C44329 Squamous cell carcinoma of skin of other parts of face: Secondary | ICD-10-CM | POA: Diagnosis not present

## 2024-02-10 ENCOUNTER — Other Ambulatory Visit (HOSPITAL_COMMUNITY): Payer: Self-pay

## 2024-02-10 ENCOUNTER — Other Ambulatory Visit: Payer: Self-pay

## 2024-02-10 MED ORDER — EZETIMIBE 10 MG PO TABS
10.0000 mg | ORAL_TABLET | Freq: Every day | ORAL | 0 refills | Status: DC
  Filled 2024-02-10: qty 90, 90d supply, fill #0

## 2024-02-11 DIAGNOSIS — C44329 Squamous cell carcinoma of skin of other parts of face: Secondary | ICD-10-CM | POA: Diagnosis not present

## 2024-02-23 ENCOUNTER — Encounter: Payer: Self-pay | Admitting: Gastroenterology

## 2024-03-04 DIAGNOSIS — E1165 Type 2 diabetes mellitus with hyperglycemia: Secondary | ICD-10-CM | POA: Diagnosis not present

## 2024-03-07 ENCOUNTER — Other Ambulatory Visit (HOSPITAL_COMMUNITY): Payer: Self-pay

## 2024-03-08 ENCOUNTER — Other Ambulatory Visit (HOSPITAL_COMMUNITY): Payer: Self-pay

## 2024-03-08 ENCOUNTER — Encounter (HOSPITAL_COMMUNITY): Payer: Self-pay

## 2024-03-09 ENCOUNTER — Other Ambulatory Visit (HOSPITAL_COMMUNITY): Payer: Self-pay

## 2024-03-09 DIAGNOSIS — C61 Malignant neoplasm of prostate: Secondary | ICD-10-CM | POA: Diagnosis not present

## 2024-03-09 MED ORDER — FAMOTIDINE 20 MG PO TABS
20.0000 mg | ORAL_TABLET | Freq: Every morning | ORAL | 3 refills | Status: AC
  Filled 2024-03-09: qty 90, 90d supply, fill #0
  Filled 2024-06-04: qty 90, 90d supply, fill #1

## 2024-03-16 DIAGNOSIS — C61 Malignant neoplasm of prostate: Secondary | ICD-10-CM | POA: Diagnosis not present

## 2024-04-06 DIAGNOSIS — K080 Exfoliation of teeth due to systemic causes: Secondary | ICD-10-CM | POA: Diagnosis not present

## 2024-04-29 ENCOUNTER — Other Ambulatory Visit: Payer: Self-pay | Admitting: Gastroenterology

## 2024-05-02 ENCOUNTER — Other Ambulatory Visit: Payer: Self-pay | Admitting: Gastroenterology

## 2024-05-02 ENCOUNTER — Other Ambulatory Visit (HOSPITAL_COMMUNITY): Payer: Self-pay

## 2024-05-02 MED ORDER — OMEPRAZOLE 40 MG PO CPDR
40.0000 mg | DELAYED_RELEASE_CAPSULE | Freq: Every day | ORAL | 2 refills | Status: AC
  Filled 2024-05-02: qty 30, 30d supply, fill #0
  Filled 2024-06-07: qty 30, 30d supply, fill #1
  Filled 2024-07-04: qty 30, 30d supply, fill #2

## 2024-05-12 ENCOUNTER — Other Ambulatory Visit (HOSPITAL_COMMUNITY): Payer: Self-pay

## 2024-05-13 ENCOUNTER — Other Ambulatory Visit: Payer: Self-pay

## 2024-05-13 ENCOUNTER — Other Ambulatory Visit (HOSPITAL_COMMUNITY): Payer: Self-pay

## 2024-05-13 MED ORDER — EZETIMIBE 10 MG PO TABS
10.0000 mg | ORAL_TABLET | Freq: Every day | ORAL | 3 refills | Status: AC
  Filled 2024-05-13: qty 90, 90d supply, fill #0

## 2024-06-02 ENCOUNTER — Other Ambulatory Visit: Payer: Self-pay

## 2024-06-06 ENCOUNTER — Other Ambulatory Visit: Payer: Self-pay
